# Patient Record
Sex: Male | Born: 1980 | Race: Black or African American | Hispanic: No | Marital: Single | Smoking: Never smoker
Health system: Southern US, Community
[De-identification: ages and names within clinical notes are randomized; demographics above are authoritative.]

## PROBLEM LIST (undated history)

## (undated) DIAGNOSIS — D509 Iron deficiency anemia, unspecified: Secondary | ICD-10-CM

## (undated) DIAGNOSIS — K5090 Crohn's disease, unspecified, without complications: Secondary | ICD-10-CM

## (undated) DIAGNOSIS — M1990 Unspecified osteoarthritis, unspecified site: Secondary | ICD-10-CM

## (undated) DIAGNOSIS — D573 Sickle-cell trait: Secondary | ICD-10-CM

## (undated) DIAGNOSIS — D571 Sickle-cell disease without crisis: Secondary | ICD-10-CM

## (undated) DIAGNOSIS — K529 Noninfective gastroenteritis and colitis, unspecified: Secondary | ICD-10-CM

## (undated) HISTORY — DX: Sickle-cell trait: D57.3

## (undated) HISTORY — DX: Iron deficiency anemia, unspecified: D50.9

## (undated) HISTORY — DX: Sickle-cell disease without crisis: D57.1

## (undated) HISTORY — PX: COLONOSCOPY: SHX174

## (undated) HISTORY — DX: Noninfective gastroenteritis and colitis, unspecified: K52.9

## (undated) HISTORY — DX: Crohn's disease, unspecified, without complications: K50.90

## (undated) HISTORY — DX: Unspecified osteoarthritis, unspecified site: M19.90

## (undated) HISTORY — PX: NO PAST SURGERIES: SHX2092

---

## 2004-12-15 ENCOUNTER — Emergency Department (HOSPITAL_COMMUNITY): Admission: EM | Admit: 2004-12-15 | Discharge: 2004-12-15 | Payer: Self-pay | Admitting: Family Medicine

## 2017-09-24 ENCOUNTER — Ambulatory Visit (HOSPITAL_COMMUNITY)
Admission: RE | Admit: 2017-09-24 | Discharge: 2017-09-24 | Disposition: A | Discharge status: Home / Self Care | Payer: Self-pay | Source: Ambulatory Visit | Attending: Obstetrics and Gynecology | Admitting: Obstetrics and Gynecology

## 2017-09-24 DIAGNOSIS — Z319 Encounter for procreative management, unspecified: Secondary | ICD-10-CM | POA: Insufficient documentation

## 2017-09-24 LAB — CBC
HCT: 43.2 % (ref 39.0–52.0)
Hemoglobin: 14.9 g/dL (ref 13.0–17.0)
MCH: 26.3 pg (ref 26.0–34.0)
MCHC: 34.5 g/dL (ref 30.0–36.0)
MCV: 76.2 fL — ABNORMAL LOW (ref 78.0–100.0)
PLATELETS: 222 10*3/uL (ref 150–400)
RBC: 5.67 MIL/uL (ref 4.22–5.81)
RDW: 16.2 % — AB (ref 11.5–15.5)
WBC: 5.5 10*3/uL (ref 4.0–10.5)

## 2017-09-28 LAB — HEMOGLOBINOPATHY EVALUATION
HGB A2 QUANT: 4.2 % — AB (ref 1.8–3.2)
HGB A: 63.9 % — AB (ref 96.4–98.8)
HGB C: 0 %
HGB F QUANT: 0 % (ref 0.0–2.0)
Hgb S Quant: 31.9 % — ABNORMAL HIGH
Hgb Variant: 0 %

## 2017-10-01 ENCOUNTER — Ambulatory Visit (HOSPITAL_COMMUNITY)
Admission: RE | Admit: 2017-10-01 | Discharge: 2017-10-01 | Disposition: A | Discharge status: Home / Self Care | Payer: Self-pay | Source: Ambulatory Visit | Attending: Obstetrics and Gynecology | Admitting: Obstetrics and Gynecology

## 2017-10-01 DIAGNOSIS — Z31448 Encounter for other genetic testing of male for procreative management: Secondary | ICD-10-CM | POA: Insufficient documentation

## 2017-10-01 NOTE — ED Notes (Signed)
Lab sample obtained to send with [facility] sample for control.

## 2017-10-05 ENCOUNTER — Other Ambulatory Visit: Payer: Self-pay

## 2020-07-31 ENCOUNTER — Emergency Department (HOSPITAL_BASED_OUTPATIENT_CLINIC_OR_DEPARTMENT_OTHER)
Admission: EM | Admit: 2020-07-31 | Discharge: 2020-07-31 | Disposition: A | Discharge status: Home / Self Care | Payer: Self-pay | Attending: Emergency Medicine | Admitting: Emergency Medicine

## 2020-07-31 ENCOUNTER — Emergency Department (HOSPITAL_BASED_OUTPATIENT_CLINIC_OR_DEPARTMENT_OTHER): Payer: Self-pay

## 2020-07-31 ENCOUNTER — Encounter (HOSPITAL_BASED_OUTPATIENT_CLINIC_OR_DEPARTMENT_OTHER): Payer: Self-pay

## 2020-07-31 ENCOUNTER — Other Ambulatory Visit: Payer: Self-pay

## 2020-07-31 DIAGNOSIS — D649 Anemia, unspecified: Secondary | ICD-10-CM | POA: Insufficient documentation

## 2020-07-31 DIAGNOSIS — R609 Edema, unspecified: Secondary | ICD-10-CM

## 2020-07-31 DIAGNOSIS — R600 Localized edema: Secondary | ICD-10-CM | POA: Diagnosis not present

## 2020-07-31 DIAGNOSIS — R2243 Localized swelling, mass and lump, lower limb, bilateral: Secondary | ICD-10-CM | POA: Diagnosis present

## 2020-07-31 LAB — CBC WITH DIFFERENTIAL/PLATELET
Abs Immature Granulocytes: 0.02 10*3/uL (ref 0.00–0.07)
Basophils Absolute: 0 10*3/uL (ref 0.0–0.1)
Basophils Relative: 1 %
Eosinophils Absolute: 0.3 10*3/uL (ref 0.0–0.5)
Eosinophils Relative: 4 %
HCT: 34.5 % — ABNORMAL LOW (ref 39.0–52.0)
Hemoglobin: 11.1 g/dL — ABNORMAL LOW (ref 13.0–17.0)
Immature Granulocytes: 0 %
Lymphocytes Relative: 22 %
Lymphs Abs: 1.5 10*3/uL (ref 0.7–4.0)
MCH: 23.9 pg — ABNORMAL LOW (ref 26.0–34.0)
MCHC: 32.2 g/dL (ref 30.0–36.0)
MCV: 74.2 fL — ABNORMAL LOW (ref 80.0–100.0)
Monocytes Absolute: 1.1 10*3/uL — ABNORMAL HIGH (ref 0.1–1.0)
Monocytes Relative: 15 %
Neutro Abs: 4.1 10*3/uL (ref 1.7–7.7)
Neutrophils Relative %: 58 %
Platelets: 418 10*3/uL — ABNORMAL HIGH (ref 150–400)
RBC: 4.65 MIL/uL (ref 4.22–5.81)
RDW: 15.5 % (ref 11.5–15.5)
WBC: 7 10*3/uL (ref 4.0–10.5)
nRBC: 0 % (ref 0.0–0.2)

## 2020-07-31 LAB — BASIC METABOLIC PANEL
Anion gap: 13 (ref 5–15)
BUN: 9 mg/dL (ref 6–20)
CO2: 25 mmol/L (ref 22–32)
Calcium: 8.8 mg/dL — ABNORMAL LOW (ref 8.9–10.3)
Chloride: 99 mmol/L (ref 98–111)
Creatinine, Ser: 0.95 mg/dL (ref 0.61–1.24)
GFR, Estimated: >60 mL/min (ref >60)
Glucose, Bld: 108 mg/dL — ABNORMAL HIGH (ref 70–99)
Potassium: 3.6 mmol/L (ref 3.5–5.1)
Sodium: 137 mmol/L (ref 135–145)

## 2020-07-31 NOTE — ED Notes (Signed)
Ultrasound at bedside

## 2020-07-31 NOTE — Discharge Instructions (Addendum)
Call your primary care doctor or specialist as discussed in the next 2-3 days.   Return immediately back to the ER if:  Your symptoms worsen within the next 12-24 hours. You develop new symptoms such as new fevers, persistent vomiting, new pain, shortness of breath, or new weakness or numbness, or if you have any other concerns.  

## 2020-07-31 NOTE — ED Provider Notes (Signed)
[facility] EMERGENCY DEPARTMENT Provider Note   CSN: [id number] Arrival date & time: 07/31/20  8338     History Chief Complaint  Patient presents with  . Knee Pain  . Ankle Pain    Jonathan Drake is a 39 y.o. male.  Patient with history of sickle cell trait, presents with lateral ankle and knee swelling.  Symptoms been ongoing for the past month intermittently.  He is tried compression socks without improvement.  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  Denies chest pain or shortness of breath otherwise.  No recent illnesses such as fever vomiting cough or diarrhea.        No past medical history on file.  There are no problems to display for this patient.    No family history on file.  Social History   Tobacco Use  . Smoking status: Never Smoker  Substance Use Topics  . Alcohol use: Never  . Drug use: Never    Home Medications Prior to Admission medications   Not on File    Allergies    Patient has no known allergies.  Review of Systems   Review of Systems  Constitutional: Negative for fever.  HENT: Negative for ear pain and sore throat.   Eyes: Negative for pain.  Respiratory: Negative for cough.   Cardiovascular: Negative for chest pain.  Gastrointestinal: Negative for abdominal pain.  Genitourinary: Negative for flank pain.  Musculoskeletal: Negative for back pain.  Skin: Negative for color change and rash.  Neurological: Negative for syncope.  All other systems reviewed and are negative.   Physical Exam Updated Vital Signs BP 135/83 (BP Location: Right Arm)   Pulse 87   Temp 98.2 F (36.8 C) (Oral)   Resp 14   Ht 6' (1.829 m)   Wt (!) 140.6 kg   SpO2 100%   BMI 42.04 kg/m   Physical Exam Constitutional:      General: He is not in acute distress.    Appearance: He is well-developed.  HENT:     Head: Normocephalic.     Mouth/Throat:     Mouth: Mucous membranes are moist.   Cardiovascular:     Rate and Rhythm: Normal rate.  Pulmonary:     Effort: Pulmonary effort is normal.  Abdominal:     Palpations: Abdomen is soft.  Musculoskeletal:     Right lower leg: Edema present.     Left lower leg: Edema present.  Skin:    General: Skin is warm.     Capillary Refill: Capillary refill takes less than 2 seconds.  Neurological:     General: No focal deficit present.     Mental Status: He is alert.     ED Results / Procedures / Treatments   Labs (all labs ordered are listed, but only abnormal results are displayed) Labs Reviewed  BASIC METABOLIC PANEL - Abnormal; Notable for the following components:      Result Value   Glucose, Bld 108 (*)    Calcium 8.8 (*)    All other components within normal limits  CBC WITH DIFFERENTIAL/PLATELET - Abnormal; Notable for the following components:   Hemoglobin 11.1 (*)    HCT 34.5 (*)    MCV 74.2 (*)    MCH 23.9 (*)    Platelets 418 (*)    Monocytes Absolute 1.1 (*)    All other components within normal limits    EKG None  Radiology US Venous Img  Lower Bilateral  Result Date: 07/31/2020 CLINICAL DATA:  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 EXAM: BILATERAL LOWER EXTREMITY VENOUS DOPPLER ULTRASOUND TECHNIQUE: Gray-scale sonography with compression, as well as color and duplex ultrasound, were performed to evaluate the deep venous system(s) from the level of the common femoral vein through the popliteal and proximal calf veins. COMPARISON:  None. FINDINGS: VENOUS Normal compressibility of the common femoral, superficial femoral, and popliteal veins, as well as the visualized calf veins. Visualized portions of profunda femoral vein and great saphenous vein unremarkable. No filling defects to suggest DVT on grayscale or color Doppler imaging. Doppler waveforms show normal direction of venous flow, normal respiratory plasticity and response to augmentation. Limited views of the  contralateral common femoral vein are unremarkable. OTHER A few somewhat prominent lymph nodes are seen in the left groin. Limitations: None. IMPRESSION: Negative for DVT. Lymph nodes in the left groin are presumably reactive or incidental. Electronically Signed   By: Inge Rise M.D.   On: 07/31/2020 11:29    Procedures Procedures (including critical care time)  Medications Ordered in ED Medications - No data to display  ED Course  I have reviewed the triage vital signs and the nursing notes.  Pertinent labs & imaging results that were available during my care of the patient were reviewed by me and considered in my medical decision making (see chart for details).    MDM Rules/Calculators/A&P                          D-dimer is negative.  No evidence of DVT noted.  Lab test show anemia consistent with his history of sickle cell disease.  His insurance is pending and he will have insurance by next month.  Advised to follow-up with the primary care doctor and hematologist within the month.  Advised immediate return if he has difficulty breathing worsening swelling fevers pain or any additional concerns.   Final Clinical Impression(s) / ED Diagnoses Final diagnoses:  Peripheral edema  Anemia, unspecified type    Rx / DC Orders ED Discharge Orders    None       Luna Fuse, MD 07/31/20 1155
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
## 2020-08-15 ENCOUNTER — Ambulatory Visit (INDEPENDENT_AMBULATORY_CARE_PROVIDER_SITE_OTHER): Payer: Self-pay | Admitting: Internal Medicine

## 2020-08-15 ENCOUNTER — Other Ambulatory Visit: Payer: Self-pay

## 2020-08-15 ENCOUNTER — Ambulatory Visit (HOSPITAL_COMMUNITY)
Admission: RE | Admit: 2020-08-15 | Discharge: 2020-08-15 | Disposition: A | Discharge status: Home / Self Care | Payer: No Typology Code available for payment source | Source: Ambulatory Visit | Attending: Internal Medicine | Admitting: Internal Medicine

## 2020-08-15 VITALS — BP 144/88 | HR 113 | Temp 98.2°F | Wt 271.3 lb

## 2020-08-15 DIAGNOSIS — D509 Iron deficiency anemia, unspecified: Secondary | ICD-10-CM

## 2020-08-15 DIAGNOSIS — M2550 Pain in unspecified joint: Secondary | ICD-10-CM | POA: Diagnosis not present

## 2020-08-15 DIAGNOSIS — I10 Essential (primary) hypertension: Secondary | ICD-10-CM

## 2020-08-15 DIAGNOSIS — M25571 Pain in right ankle and joints of right foot: Secondary | ICD-10-CM

## 2020-08-15 DIAGNOSIS — M25572 Pain in left ankle and joints of left foot: Secondary | ICD-10-CM

## 2020-08-15 MED ORDER — HYDROCHLOROTHIAZIDE 25 MG PO TABS: 25.0000 mg | ORAL_TABLET | Freq: Every day | ORAL | 0 refills | Status: DC

## 2020-08-15 MED ORDER — DICLOFENAC SODIUM 1 % EX GEL: 4.0000 g | Freq: Four times a day (QID) | CUTANEOUS | 1 refills | Status: DC
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
## 2020-08-15 NOTE — Patient Instructions (Addendum)
Jonathan Drake, It was nice meeting you, and we're glad to have you establishing care with [place]!   Today we talked about:   Your joint pain and swelling: I'm going to get some lab work and x-rays today to start trying to figure out an underlying cause. In the mean time, you may get benefit from a topical inflammatory gel that I have sent to your pharmacy.   Blood pressure: your blood pressure has been elevated on multiple visits so I think it would be reasonable to go ahead and start you on a medication. This may help your swelling a bit as well.   Let's see you back in 2 weeks to re-check your blood pressure and discuss next steps depending on what your work-up shows.  Take care! Dr. Koleen Distance

## 2020-08-16 ENCOUNTER — Encounter: Payer: Self-pay | Admitting: Internal Medicine

## 2020-08-16 DIAGNOSIS — M2550 Pain in unspecified joint: Secondary | ICD-10-CM | POA: Insufficient documentation

## 2020-08-16 DIAGNOSIS — D509 Iron deficiency anemia, unspecified: Secondary | ICD-10-CM | POA: Insufficient documentation

## 2020-08-16 DIAGNOSIS — I10 Essential (primary) hypertension: Secondary | ICD-10-CM | POA: Insufficient documentation

## 2020-08-16 LAB — MICROSCOPIC EXAMINATION
Bacteria, UA: NONE SEEN
Casts: NONE SEEN /lpf
WBC, UA: NONE SEEN /hpf (ref 0–5)

## 2020-08-16 LAB — CMP14 + ANION GAP
Albumin: 3.3 g/dL — ABNORMAL LOW (ref 4.0–5.0)
CO2: 25 mmol/L (ref 20–29)
GFR calc Af Amer: 127 mL/min/{1.73_m2} (ref >59)

## 2020-08-16 LAB — URINALYSIS, COMPLETE
Bilirubin, UA: NEGATIVE
Glucose, UA: NEGATIVE
Leukocytes,UA: NEGATIVE
Nitrite, UA: NEGATIVE
RBC, UA: NEGATIVE
Specific Gravity, UA: 1.012 (ref 1.005–1.030)
Urobilinogen, Ur: 0.2 mg/dL (ref 0.2–1.0)
pH, UA: 6 (ref 5.0–7.5)

## 2020-08-16 LAB — FERRITIN: Ferritin: 777 ng/mL — ABNORMAL HIGH (ref 30–400)

## 2020-08-16 MED ORDER — FERROUS SULFATE 325 (65 FE) MG PO TABS: 325.0000 mg | ORAL_TABLET | Freq: Every day | ORAL | 3 refills | Status: DC

## 2020-08-16 NOTE — Assessment & Plan Note (Signed)
Patient has now had multiple elevated blood pressure readings in various clinical settings.  Will start him on HCTZ 25 mg daily. Re-check BP and repeat BMP at follow-up visit in 2 weeks.

## 2020-08-16 NOTE — Assessment & Plan Note (Addendum)
Patient presents with joint pain and swelling that began approximately 1 month ago. Symptoms started in both ankles which is what prompted visit to the ED. Work-up there was unremarkable apart from microcytic anemia. 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 Swelling will eventually go down with elevation of his legs at night. He tries to combat swelling with compression stockings without much relief.  He endorses a deep ache in both ankles that is worse with ambulation.  A few days ago, he noticed his right hand developed similar pain and swelling. He cannot make a fist due to the pain and swelling around his knuckles.  He has never had a history of similar symptoms. No other systemic symptoms. No family history of autoimmune or rheumatologic diseases.   Overall, symptoms concerning for underlying autoimmune or inflammatory process which would also explain his microcytic anemia.  X-rays of his joints today do not show any significant joint breakdown.  Will start basic work-up with CMP, ESR, CRP, U/A, ANA.  Close follow-up in 2 weeks to determine next steps and if referral is indicated.

## 2020-08-16 NOTE — Assessment & Plan Note (Signed)
Iron studies today consistent with iron deficiency. Ferritin is elevated which would go along with underlying inflammatory or autoimmune disorder. Will start PO supplementation. Re-check iron studies in several weeks, and if he is not responding appropriately we will need to set him up for IV iron infusion.

## 2020-08-17 LAB — CMP14 + ANION GAP
ALT: 17 IU/L (ref 0–44)
AST: 13 IU/L (ref 0–40)
Albumin/Globulin Ratio: 0.7 — ABNORMAL LOW (ref 1.2–2.2)
Alkaline Phosphatase: 55 IU/L (ref 44–121)
Anion Gap: 16 mmol/L (ref 10.0–18.0)
BUN/Creatinine Ratio: 8 — ABNORMAL LOW (ref 9–20)
BUN: 7 mg/dL (ref 6–20)
Bilirubin Total: 0.4 mg/dL (ref 0.0–1.2)
Calcium: 9.3 mg/dL (ref 8.7–10.2)
Chloride: 95 mmol/L — ABNORMAL LOW (ref 96–106)
Creatinine, Ser: 0.88 mg/dL (ref 0.76–1.27)
GFR calc non Af Amer: 110 mL/min/{1.73_m2} (ref >59)
Globulin, Total: 5 g/dL — ABNORMAL HIGH (ref 1.5–4.5)
Glucose: 114 mg/dL — ABNORMAL HIGH (ref 65–99)
Potassium: 4.2 mmol/L (ref 3.5–5.2)
Sodium: 136 mmol/L (ref 134–144)
Total Protein: 8.3 g/dL (ref 6.0–8.5)

## 2020-08-17 LAB — IRON: Iron: 15 ug/dL — ABNORMAL LOW (ref 38–169)

## 2020-08-17 LAB — C-REACTIVE PROTEIN: CRP: 132 mg/L — ABNORMAL HIGH (ref 0–10)

## 2020-08-17 LAB — SEDIMENTATION RATE: Sed Rate: 1 mm/hr (ref 0–15)

## 2020-08-17 LAB — ANTINUCLEAR ANTIBODIES, IFA: ANA Titer 1: NEGATIVE

## 2020-08-19 NOTE — Progress Notes (Signed)
Internal Medicine Clinic Attending  Case discussed with Dr. Bloomfield  At the time of the visit.  We reviewed the resident's history and exam and pertinent patient test results.  I agree with the assessment, diagnosis, and plan of care documented in the resident's note.  

## 2020-08-29 ENCOUNTER — Ambulatory Visit: Payer: No Typology Code available for payment source | Admitting: Internal Medicine

## 2020-08-29 ENCOUNTER — Encounter: Payer: Self-pay | Admitting: Internal Medicine

## 2020-08-29 VITALS — BP 137/74 | HR 96 | Wt 266.0 lb

## 2020-08-29 DIAGNOSIS — D509 Iron deficiency anemia, unspecified: Secondary | ICD-10-CM

## 2020-08-29 DIAGNOSIS — M2550 Pain in unspecified joint: Secondary | ICD-10-CM | POA: Diagnosis not present

## 2020-08-29 DIAGNOSIS — I10 Essential (primary) hypertension: Secondary | ICD-10-CM | POA: Diagnosis not present

## 2020-08-29 NOTE — Patient Instructions (Signed)
Jonathan Drake, It was nice seeing you again!  Today we discussed:  Blood pressure - it looks better after starting the medication. I will check your kidney function and electrolytes today to be sure everything looks good.   Joint pain - you can continue taking ibuprofen and using the gel like we discussed  Iron deficiency/loose stools - I am referring you to a GI specialist for further work-up. It may give [place] the answers for both the GI symptoms and your joint pain   Take care, Dr. Koleen Distance

## 2020-08-29 NOTE — Progress Notes (Signed)
Acute Office Visit  Subjective:    Patient ID: Jonathan Drake, male    DOB: May 10, 1981, 40 y.o.   MRN: [id number]  Chief Complaint  Patient presents with  . 2 Week Follow-up    HPI Patient is in today for follow-up on joint pain and chronic loose stools. Please see problem based charting for further details on today's visit.   Past Medical History:  Diagnosis Date  . Iron deficiency anemia   . Sickle cell trait ([place])     History reviewed. No pertinent surgical history.  Family History  Problem Relation Age of Onset  . Diabetes Mother   . Aneurysm Mother   . CAD Father   . Arrhythmia Brother        2 brothers with pacemakers   . Heart attack Other        uncle died of a heart attack at age 79    Social History   Socioeconomic History  . Marital status: Single    Spouse name: Not on file  . Number of children: Not on file  . Years of education: Not on file  . Highest education level: Not on file  Occupational History  . Not on file  Tobacco Use  . Smoking status: Never Smoker  . Smokeless tobacco: Never Used  Substance and Sexual Activity  . Alcohol use: Never  . Drug use: Never  . Sexual activity: Not on file  Other Topics Concern  . Not on file  Social History Narrative  . Not on file   Social Determinants of Health   Financial Resource Strain: Not on file  Food Insecurity: Not on file  Transportation Needs: Not on file  Physical Activity: Not on file  Stress: Not on file  Social Connections: Not on file  Intimate Partner Violence: Not on file    Outpatient Medications Prior to Visit  Medication Sig Dispense Refill  . diclofenac Sodium (VOLTAREN) 1 % GEL Apply 4 g topically 4 (four) times daily. 350 g 1  . ferrous sulfate 325 (65 FE) MG tablet Take 1 tablet (325 mg total) by mouth daily. 30 tablet 3  . hydrochlorothiazide (HYDRODIURIL) 25 MG tablet Take 1 tablet (25 mg total) by mouth daily. 30 tablet 0   No facility-administered medications  prior to visit.    No Known Allergies  Review of Systems  Constitutional: Negative for chills, fatigue, fever and unexpected weight change.  HENT: Negative for mouth sores.   Eyes: Negative for visual disturbance.  Gastrointestinal: Positive for blood in stool and diarrhea. Negative for abdominal distention, abdominal pain, nausea, rectal pain and vomiting.  Musculoskeletal: Positive for arthralgias and joint swelling. Negative for back pain.  Skin: Negative for rash.       Objective:    Physical Exam Constitutional:      General: He is not in acute distress.    Appearance: Normal appearance.  Abdominal:     General: There is no distension.     Palpations: Abdomen is soft.     Tenderness: There is no abdominal tenderness.  Musculoskeletal:     Comments: Right hand and bilateral ankle swelling unchanged from previous exam. The joints themselves are less tender to palpation. His ROM has slightly improved in the right hand in terms of ability to make a fist.   Neurological:     Mental Status: He is alert.     BP 137/74 (BP Location: Left Arm, Patient Position: Sitting, Cuff Size: Normal)  Pulse 96   Wt 266 lb (120.7 kg)   SpO2 97%   BMI 36.08 kg/m  Wt Readings from Last 3 Encounters:  08/29/20 266 lb (120.7 kg)  08/15/20 271 lb 4.8 oz (123.1 kg)  07/31/20 (!) 310 lb (140.6 kg)    Health Maintenance Due  Topic Date Due  . Hepatitis C Screening  Never done  . COVID-19 Vaccine (1) Never done  . HIV Screening  Never done  . TETANUS/TDAP  Never done  . INFLUENZA VACCINE  Never done    There are no preventive care reminders to display for this patient.   No results found for: TSH Lab Results  Component Value Date   WBC 7.0 07/31/2020   HGB 11.1 (L) 07/31/2020   HCT 34.5 (L) 07/31/2020   MCV 74.2 (L) 07/31/2020   PLT 418 (H) 07/31/2020   Lab Results  Component Value Date   NA 138 08/29/2020   K 3.9 08/29/2020   CO2 27 08/29/2020   GLUCOSE 113 (H)  08/29/2020   BUN 7 08/29/2020   CREATININE 0.72 (L) 08/29/2020   BILITOT 0.4 08/15/2020   ALKPHOS 55 08/15/2020   AST 13 08/15/2020   ALT 17 08/15/2020   PROT 8.3 08/15/2020   ALBUMIN 3.3 (L) 08/15/2020   CALCIUM 9.4 08/29/2020   ANIONGAP 13 07/31/2020   No results found for: CHOL No results found for: HDL No results found for: LDLCALC No results found for: TRIG No results found for: CHOLHDL No results found for: HGBA1C     Assessment & Plan:   Problem List Items Addressed This Visit      Cardiovascular and Mediastinum   Hypertension - Primary    Blood pressure has improved since starting HCTZ 25 mg at last visit. BMP shows stable renal function and electrolytes.  Continue current therapy.       Relevant Orders   BMP8+Anion Gap (Completed)     Other   Polyarthralgia    Joint swelling unchanged from last visit. He has not developed symptoms in any other joints. Work-up thus far shows elevated CRP and ferritin consistent with inflammatory process.  ANA was normal. His pain is fairly controlled on Ibuprofen for the time being so will hold off on steroids for now to avoid potentially affecting colonoscopy results. Checking RF and CCP today. Further work-up pending GI referral as below.       Relevant Orders   Rheumatoid (RA) Factor (Completed)   CYCLIC CITRUL PEPTIDE ANTIBODY, IGG/IGA (Completed)   Iron deficiency anemia    Mr. Witt reports several month history of intermittent loose stools. Symptoms will flare for 1-2 weeks at a time, and he will subsequently have more formed stools. For the last month, he feels like his stools have been consistently loose. He also notes occasional blood visible in the bowl and when he wipes. He denies any abdominal pain, pain with BMs, or tenesmus. Certain foods do not seem to affect his bowel movements. He has lost approximately 50 lbs in the last year, but states this was intentional through his dieting. Symptoms concerning for  inflammatory bowel disease or malabsorption. Will refer to GI for diagnostic colonoscopy. Depending on work-up, this may be the underlying cause for his polyarthralgia as well.  Continue PO iron supplementation which he is tolerating well.       Relevant Orders   Ambulatory referral to Gastroenterology       No orders of the defined types were placed in this encounter.  Delice Bison, DO

## 2020-08-30 ENCOUNTER — Encounter: Payer: Self-pay | Admitting: Internal Medicine

## 2020-08-30 NOTE — Assessment & Plan Note (Addendum)
Jonathan Drake reports several month history of intermittent loose stools. Symptoms will flare for 1-2 weeks at a time, and he will subsequently have more formed stools. For the last month, he feels like his stools have been consistently loose. He also notes occasional blood visible in the bowl and when he wipes. He denies any abdominal pain, pain with BMs, or tenesmus. Certain foods do not seem to affect his bowel movements. He has lost approximately 50 lbs in the last year, but states this was intentional through his dieting. Symptoms concerning for inflammatory bowel disease or malabsorption. Will refer to GI for diagnostic colonoscopy. Depending on work-up, this may be the underlying cause for his polyarthralgia as well.  Continue PO iron supplementation which he is tolerating well.

## 2020-08-30 NOTE — Assessment & Plan Note (Signed)
Joint swelling unchanged from last visit. He has not developed symptoms in any other joints. Work-up thus far shows elevated CRP and ferritin consistent with inflammatory process.  ANA was normal. His pain is fairly controlled on Ibuprofen for the time being so will hold off on steroids for now to avoid potentially affecting colonoscopy results. Checking RF and CCP today. Further work-up pending GI referral as below.

## 2020-08-30 NOTE — Assessment & Plan Note (Addendum)
Blood pressure has improved since starting HCTZ 25 mg at last visit. BMP shows stable renal function and electrolytes.  Continue current therapy.

## 2020-08-31 LAB — BMP8+ANION GAP
Anion Gap: 14 mmol/L (ref 10.0–18.0)
BUN/Creatinine Ratio: 10 (ref 9–20)
BUN: 7 mg/dL (ref 6–20)
CO2: 27 mmol/L (ref 20–29)
Calcium: 9.4 mg/dL (ref 8.7–10.2)
Chloride: 97 mmol/L (ref 96–106)
Creatinine, Ser: 0.72 mg/dL — ABNORMAL LOW (ref 0.76–1.27)
GFR calc Af Amer: 138 mL/min/{1.73_m2} (ref >59)
GFR calc non Af Amer: 119 mL/min/{1.73_m2} (ref >59)
Glucose: 113 mg/dL — ABNORMAL HIGH (ref 65–99)
Potassium: 3.9 mmol/L (ref 3.5–5.2)
Sodium: 138 mmol/L (ref 134–144)

## 2020-08-31 LAB — RHEUMATOID FACTOR: Rhuematoid fact SerPl-aCnc: 14.1 IU/mL — ABNORMAL HIGH (ref <14.0)

## 2020-08-31 LAB — CYCLIC CITRUL PEPTIDE ANTIBODY, IGG/IGA: Cyclic Citrullin Peptide Ab: 12 units (ref 0–19)

## 2020-09-02 ENCOUNTER — Encounter: Payer: Self-pay | Admitting: Internal Medicine

## 2020-09-02 MED ORDER — IBUPROFEN 600 MG PO TABS: 600.0000 mg | ORAL_TABLET | Freq: Four times a day (QID) | ORAL | 0 refills | Status: DC | PRN

## 2020-09-02 NOTE — Progress Notes (Signed)
Internal Medicine Clinic Attending  Case discussed with Dr. Bloomfield  At the time of the visit.  We reviewed the resident's history and exam and pertinent patient test results.  I agree with the assessment, diagnosis, and plan of care documented in the resident's note.  

## 2020-09-03 ENCOUNTER — Encounter: Payer: Self-pay | Admitting: Internal Medicine

## 2020-09-03 ENCOUNTER — Ambulatory Visit (INDEPENDENT_AMBULATORY_CARE_PROVIDER_SITE_OTHER): Payer: No Typology Code available for payment source | Admitting: Internal Medicine

## 2020-09-03 ENCOUNTER — Other Ambulatory Visit: Payer: No Typology Code available for payment source

## 2020-09-03 VITALS — BP 120/68 | HR 106 | Ht 72.0 in | Wt 262.4 lb

## 2020-09-03 DIAGNOSIS — D500 Iron deficiency anemia secondary to blood loss (chronic): Secondary | ICD-10-CM

## 2020-09-03 DIAGNOSIS — R197 Diarrhea, unspecified: Secondary | ICD-10-CM | POA: Diagnosis not present

## 2020-09-03 DIAGNOSIS — M130 Polyarthritis, unspecified: Secondary | ICD-10-CM

## 2020-09-03 MED ORDER — SUTAB 1479-225-188 MG PO TABS: 1.0000 | ORAL_TABLET | Freq: Once | ORAL | 0 refills | Status: AC

## 2020-09-03 NOTE — Progress Notes (Signed)
Patient ID: Jonathan Drake, male   DOB: 14-Nov-1980, 40 y.o.   MRN: [id number] HPI: Jonathan Drake is a 40 year old male with sickle cell trait who is seen in consult at the request of Dr. Koleen Distance and Dr. Evette Doffing to evaluate bloody diarrhea, diffuse joint pains and iron deficiency anemia.  He is here alone today.  He reports that symptoms really seem to start after Thanksgiving 2021 though in June 2021 he had a few days here and there of looser stools.  Prior to the last few months he would have regular bowel movements without blood occurring usually 1 occasionally 2 times per day.  At that time stools were formed and normal-appearing.  However after Thanksgiving and in early December he developed joint pains and lower extremity swelling.  He had joint pains in his ankles knees and hands.  No back pain, shoulder pain or hip pain.  He also developed loose stools with intermittent blood.  Stools are occurring at least 3 times per day and can be urgent.  He has not had any abdominal pain.  He has not had upper GI symptoms such as heartburn, dysphagia, odynophagia, nausea or vomiting.  He has lost nearly 40 pounds over the last 3 months but he reports some of this is due to diet.  He has been conscious with what he eats but also states he is afraid to eat certain foods because he worries it will make the diarrhea worse.  He is avoided sugar drinks and higher fat foods such as potato chips.  He was started on oral iron and hydrochlorothiazide about 2 weeks ago by primary care.  His lower extremity swelling is still present in the evening but better in the morning.  He is also using ibuprofen at least once daily in the evening.  He is also recently started on vitamin D supplementation.  He has a fiance, [employer], as well as 4 children.  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  No tobacco or alcohol.  No illicit drug use.  He has sickle cell trait which was  diagnosed several years ago when his partner also was noted to have sickle cell trait and they were planning to have children.  Anemia is new for him.  There is no family history of GI tract malignancy, celiac disease or IBD to his knowledge.  Past Medical History:  Diagnosis Date  . Iron deficiency anemia   . Sickle cell trait [facility])     Past Surgical History:  Procedure Laterality Date  . NO PAST SURGERIES      Outpatient Medications Prior to Visit  Medication Sig Dispense Refill  . acetaminophen (TYLENOL 8 HOUR ARTHRITIS PAIN) 650 MG CR tablet Take 650 mg by mouth every 8 (eight) hours as needed for pain.    . ferrous sulfate 325 (65 FE) MG tablet Take 1 tablet (325 mg total) by mouth daily. 30 tablet 3  . hydrochlorothiazide (HYDRODIURIL) 25 MG tablet Take 1 tablet (25 mg total) by mouth daily. 30 tablet 0  . ibuprofen (ADVIL) 600 MG tablet Take 1 tablet (600 mg total) by mouth every 6 (six) hours as needed. 30 tablet 0  . diclofenac Sodium (VOLTAREN) 1 % GEL Apply 4 g topically 4 (four) times daily. (Patient not taking: Reported on 09/03/2020) 350 g 1   No facility-administered medications prior to visit.    No Known Allergies  Family History  Problem Relation Age  of Onset  . Diabetes Mother   . Aneurysm Mother   . CAD Father   . Arrhythmia Brother        2 brothers with pacemakers   . Heart attack Other        uncle died of a heart attack at age 39  . Colon cancer Neg Hx   . Esophageal cancer Neg Hx   . Pancreatic cancer Neg Hx   . Ulcerative colitis Neg Hx     Social History   Tobacco Use  . Smoking status: Never Smoker  . Smokeless tobacco: Never Used  Vaping Use  . Vaping Use: Never used  Substance Use Topics  . Alcohol use: Never  . Drug use: Never    ROS: As per history of present illness, otherwise negative  BP 120/68 (BP Location: Left Arm, Patient Position: Sitting, Cuff Size: Large)   Pulse (!) 106   Ht 6' (1.829 m)   Wt 262 lb 6 oz (119 kg)    BMI 35.58 kg/m  Constitutional: Well-developed and well-nourished. No distress. HEENT: Normocephalic and atraumatic.  Conjunctivae are normal.  No scleral icterus. Neck: Neck supple. Trachea midline. Cardiovascular: Tachycardic but regular. No M/R/G Pulmonary/chest: Effort normal and breath sounds normal. No wheezing, rales or rhonchi. Abdominal: Soft, nontender, nondistended. Bowel sounds active throughout. There are no masses palpable. No hepatosplenomegaly. Extremities: no clubbing, cyanosis, trace pretibial edema to the midshin Neurological: Alert and oriented to person place and time. Skin: Skin is warm and dry.  Psychiatric: Normal mood and affect. Behavior is normal.  RELEVANT LABS AND IMAGING: CBC    Component Value Date/Time   WBC 7.0 07/31/2020 1031   RBC 4.65 07/31/2020 1031   HGB 11.1 (L) 07/31/2020 1031   HCT 34.5 (L) 07/31/2020 1031   PLT 418 (H) 07/31/2020 1031   MCV 74.2 (L) 07/31/2020 1031   MCH 23.9 (L) 07/31/2020 1031   MCHC 32.2 07/31/2020 1031   RDW 15.5 07/31/2020 1031   LYMPHSABS 1.5 07/31/2020 1031   MONOABS 1.1 (H) 07/31/2020 1031   EOSABS 0.3 07/31/2020 1031   BASOSABS 0.0 07/31/2020 1031    CMP     Component Value Date/Time   NA 138 08/29/2020 1407   K 3.9 08/29/2020 1407   CL 97 08/29/2020 1407   CO2 27 08/29/2020 1407   GLUCOSE 113 (H) 08/29/2020 1407   GLUCOSE 108 (H) 07/31/2020 1031   BUN 7 08/29/2020 1407   CREATININE 0.72 (L) 08/29/2020 1407   CALCIUM 9.4 08/29/2020 1407   PROT 8.3 08/15/2020 1446   ALBUMIN 3.3 (L) 08/15/2020 1446   AST 13 08/15/2020 1446   ALT 17 08/15/2020 1446   ALKPHOS 55 08/15/2020 1446   BILITOT 0.4 08/15/2020 1446   GFRNONAA 119 08/29/2020 1407   GFRNONAA >60 07/31/2020 1031   GFRAA 138 08/29/2020 1407   Rheumatoid factor positive at 14.1 ESR 1 CRP 132 Ferritin 777 Iron 15 ANA negative CCP negative  ASSESSMENT/PLAN: 40 year old male with sickle cell trait who is seen in consult at the request of  Dr. Koleen Distance and Dr. Evette Doffing to evaluate bloody diarrhea, diffuse joint pains and iron deficiency anemia.   1.  Bloody diarrhea/iron deficiency anemia --very suspicious for inflammatory diarrhea and specifically IBD.  We discussed this together.  Also arthropathy possibly related IBD or overlapping with an autoimmune inflammatory arthropathy.  We discussed this together today.  His ferritin is elevated likely is an acute phase reactant though iron was low.  He is microcytic but  also has sickle cell trait.  I recommended the following: --Exclusion of C. difficile with PCR --1 C. 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  Will evaluate #1 above.  This could be arthropathy related to IBD or overlapping inflammatory arthropathy for which I would want him to see rheumatology.  Will likely need to limit NSAIDs given #1 and certainly complete avoidance if he has IBD.  XF:FKVQOHC, Mallie Mussel, [facility] [address],  [place] [id number]  Modena Nunnery, DO

## 2020-09-03 NOTE — Patient Instructions (Signed)
Your provider has requested that you go to the basement level for lab work before leaving today. Press "B" on the elevator. The lab is located at the first door on the left as you exit the elevator.  You have been scheduled for an endoscopy and colonoscopy. Please follow written instructions given to you at your visit today.  Please pick up your prep supplies at the pharmacy within the next 1-3 days. If you use inhalers (even only as needed), please bring them with you on the day of your procedure.

## 2020-09-04 LAB — CLOSTRIDIUM DIFFICILE BY PCR: Toxigenic C. Difficile by PCR: NEGATIVE

## 2020-09-05 ENCOUNTER — Other Ambulatory Visit: Payer: Self-pay | Admitting: Internal Medicine

## 2020-09-06 LAB — SARS CORONAVIRUS 2 (TAT 6-24 HRS): SARS Coronavirus 2: NEGATIVE

## 2020-09-09 ENCOUNTER — Encounter: Payer: No Typology Code available for payment source | Admitting: Internal Medicine

## 2020-09-09 ENCOUNTER — Encounter: Payer: Self-pay | Admitting: Internal Medicine

## 2020-09-09 NOTE — Telephone Encounter (Signed)
I have spoken to this very nice patient to advise that we do still very much recommend both endoscopy and colonoscopy especially in lite of recent elevated labwork etc. Advised that unfortunately, his insurance plan just doesn't really provide coverage for these so he would be responsible for any accrued fees. Patient is advised that it may be beneficial for him to apply for [facility] assistance to see if they may be able to get his costs down for procedures or even cover them retroactively. He is also advised to maybe even look into other insurance plans that cover procedures. Patient indicates that he is already looking around and that he was already in touch with [employer] [employer] but was told he would have to first be billed for his procedures before he could be considered for the assistance. Patient thanked me for the call.

## 2020-09-09 NOTE — Telephone Encounter (Signed)
I have left a message for patient to call back.

## 2020-09-09 NOTE — Telephone Encounter (Signed)
Jonathan Drake,  Here is a message from the patient.  Colonoscopy really is the best option. If I had to choose colonoscopy more important than EGD, but with coverage I still recommend both tests Thanks for trying to help CC: [facility]

## 2020-09-09 NOTE — Telephone Encounter (Signed)
Per Morton Amy, precert coordinator,  Good morning! Yes Sir. I put a note in the ambulatory referral on 09/05/20. Per Chrisina @ Benefits Pt has a limited policy and has no diagnostic benefits for these procedures. Left detailed msg on patient's vm and asked if he needed to cancel to please call in.  Per Dr Hilarie Fredrickson,  ok thanks. Dottie, can you reach out to him. No other great option for diagnosis. Concern is IBD. He should be directed to get [facility] assistance or at least apply. my understanding that if granted this assistance can be retroactive for services already rendered

## 2020-09-12 ENCOUNTER — Ambulatory Visit (INDEPENDENT_AMBULATORY_CARE_PROVIDER_SITE_OTHER): Payer: No Typology Code available for payment source | Admitting: Internal Medicine

## 2020-09-12 ENCOUNTER — Other Ambulatory Visit: Payer: Self-pay

## 2020-09-12 ENCOUNTER — Encounter: Payer: Self-pay | Admitting: Internal Medicine

## 2020-09-12 VITALS — BP 140/79 | HR 91 | Temp 98.6°F | Ht 72.0 in | Wt 203.2 lb

## 2020-09-12 DIAGNOSIS — M2550 Pain in unspecified joint: Secondary | ICD-10-CM | POA: Diagnosis not present

## 2020-09-12 MED ORDER — DICLOFENAC SODIUM 1 % EX GEL: 4.0000 g | Freq: Four times a day (QID) | CUTANEOUS | 1 refills | Status: DC

## 2020-09-12 NOTE — Progress Notes (Signed)
   CC: Polyarthralgia   HPI:Mr.Jonathan Drake is a 40 y.o. male who presents for evaluation of polyarthralgia. Please see individual problem based A/P for details.    Past Medical History:  Diagnosis Date  . Iron deficiency anemia   . Sickle cell trait ([place])    Review of Systems:   Review of Systems  Constitutional: Positive for weight loss. Negative for chills and fever.  Respiratory: Negative for cough and shortness of breath.   Gastrointestinal: Negative for blood in stool (none this week).  Musculoskeletal: Positive for joint pain. Negative for falls.     Physical Exam: Vitals:   09/12/20 1609  BP: 140/79  Pulse: 91  Temp: 98.6 F (37 C)  TempSrc: Oral  SpO2: 99%  Weight: 203 lb 3.2 oz (92.2 kg)  Height: 6' (1.829 m)   Physical Exam Constitutional:      General: He is not in acute distress.    Appearance: He is not ill-appearing.  Cardiovascular:     Rate and Rhythm: Normal rate and regular rhythm.  Pulmonary:     Breath sounds: No wheezing or rhonchi.  Abdominal:     General: Bowel sounds are normal.     Palpations: Abdomen is soft.  Musculoskeletal:        General: Swelling (bilateral hands) and tenderness (bilateral hands ) present.  Skin:    Findings: No erythema or rash.  Neurological:     Sensory: No sensory deficit.     Motor: Weakness (weakness on hand grip in both hands) present.  Psychiatric:        Mood and Affect: Mood normal.        Behavior: Behavior normal.      Assessment & Plan:   See Encounters Tab for problem based charting.  Patient discussed with Dr. Jimmye Norman

## 2020-09-12 NOTE — Patient Instructions (Addendum)
Thank you, Mr.Marven F Cosgriff for allowing [place] to provide your care today. Today we discussed polyarthralgias , work Physicist, medical, child support letter, and referral to rheumatology  I have ordered the following labs for you:  Lab Orders  No laboratory test(s) ordered today     Tests ordered today:    Referrals ordered today:    Referral Orders     Ambulatory referral to Rheumatology   I have ordered the following medication/changed the following medications:   Stop the following medications: There are no discontinued medications.   Start the following medications: Meds ordered this encounter  Medications  . diclofenac Sodium (VOLTAREN) 1 % GEL    Sig: Apply 4 g topically 4 (four) times daily.    Dispense:  100 g    Refill:  1     Follow up: 2 months    Remember: I will follow up with Dr. Rhea Belton tomorrow and give you a call on our recommendations  Should you have any questions or concerns please call the internal medicine clinic at [phone].      Thurmon Fair, M.D. [facility]

## 2020-09-13 ENCOUNTER — Encounter: Payer: Self-pay | Admitting: *Deleted

## 2020-09-13 ENCOUNTER — Encounter: Payer: Self-pay | Admitting: Internal Medicine

## 2020-09-13 ENCOUNTER — Telehealth: Payer: Self-pay | Admitting: *Deleted

## 2020-09-13 DIAGNOSIS — M130 Polyarthritis, unspecified: Secondary | ICD-10-CM

## 2020-09-13 DIAGNOSIS — R197 Diarrhea, unspecified: Secondary | ICD-10-CM

## 2020-09-13 DIAGNOSIS — D500 Iron deficiency anemia secondary to blood loss (chronic): Secondary | ICD-10-CM

## 2020-09-13 NOTE — Telephone Encounter (Signed)
-----   Message from Jerene Bears, MD sent at 09/13/2020  1:48 PM EST ----- Regarding: RE: Prednisone? I will get him rescheduled.  Thanks Willis Modena Pt needs ECL after 10/08/20, same indications as before JMP  ----- Message ----- From: Madalyn Rob, MD Sent: 09/13/2020   1:47 PM EST To: Jerene Bears, MD Subject: RE: Prednisone?                                Dr.Pyrtle,  He has new insurance which goes in affect March 1.  This insurance will cover the EGD and colonoscopy.  He has short-term disability which will keep him afloat this month.  If things do not worsen he says he can tolerate the discomfort. Given this new information and knowing it could interfere with your endoscopic evaluation, I favor holding off. Anything I need to do to get him back on your schedule in March?   Thanks,  Merry Proud ----- Message ----- From: Jerene Bears, MD Sent: 09/13/2020  11:25 AM EST To: Madalyn Rob, MD Subject: RE: Prednisone?                                Hi Merry Proud Thanks for your message. Unfortunately he cancelled his egd and colon due to lack of insurance coverage This is very unfortunate and I was hoping he could get [facility] assistance so we can proceed. From GI standpt I would not treat with steroids until endoscopic eval as this could alter the diagnostic potential of the scopes. That said, if he cannot get his procedures due to lack of insurance coverage or lack of [facility] assistance, then I have no issue with prednisone (esp for another indication such as arthropathy). What dose and duration were you considering? Short course likely would not alter the gut for very long. Thanks Zenovia Jarred  ----- Message ----- From: Madalyn Rob, MD Sent: 09/13/2020   9:07 AM EST To: Jerene Bears, MD Subject: Prednisone?                                    Dr.Pyrtle,  Dr.Bloomfield/Dr.Vincent referred this patient to you. I saw him in clinic yesterday. Due to poor grip strength/hand pain he can not work (drive the  18 wheeler safely). Your thoughts on treating with prednisone or waiting until you can perform endoscopy/colonoscopy? I placed the rheumatology referral , so hopefully he can get in not long after your testing.   Thank you for your time,  Tamsen Snider PGY2 - Internal Medicine

## 2020-09-13 NOTE — Assessment & Plan Note (Addendum)
See previous notes by Dr. Chesley Mires and notes by Dr. Rhea Belton with gastroenterology.  Patient is here to discuss worsening symptoms he has been worked up for including bloody diarrhea and diffuse joint pains.  It is affecting both right and left hand mostly this week.  He is awaiting his new insurance to kick in to pay for endoscopy and colonoscopy.  However the worsening pain in his hands and weakness makes it unsafe for him to work.  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  He asked if we can provide documentation he has been worked up for this condition and also documentation for the court ( may prevent him from paying child support).  Given the impact this is having on his life I would discuss with Dr. Rhea Belton his thoughts on starting prednisone or if he would prefer waiting until after colonoscopy/edndoscopy.  I do not see a rheumatology referral placed.  In my experience it takes a few months to get a rheumatology appointment. We will place referral today so his care is not delayed after colonoscopy and endoscopy. He has been taking Ibuprofen on empty stomach, recommended taking with food. Will prescribe Voltaren gel , so patient can avoid oral NSAIDS if possible.   - Contact Dr.Pyrtle to discuss starting prednisone - Send letters patient has requested - Referral to Rheumatology - Voltaren gel

## 2020-09-13 NOTE — Telephone Encounter (Signed)
I have spoken to patient and rescheduled his appointment for endoscopy/colonoscopy on 10/18/20. New instructions have been sent to him via [place]. In addition, he is rescheduled for COVID testing and this is also reflected in his updated instructions. Patient verbalizes understanding. He indicates that he will get [place] updated insurance information as soon as his card becomes available to him.

## 2020-09-16 ENCOUNTER — Telehealth: Payer: Self-pay | Admitting: Internal Medicine

## 2020-09-16 ENCOUNTER — Other Ambulatory Visit: Payer: Self-pay

## 2020-09-16 ENCOUNTER — Encounter: Payer: Self-pay | Admitting: Internal Medicine

## 2020-09-16 ENCOUNTER — Other Ambulatory Visit: Payer: Self-pay | Admitting: Internal Medicine

## 2020-09-16 MED ORDER — FERROUS SULFATE 325 (65 FE) MG PO TABS: 325.0000 mg | ORAL_TABLET | Freq: Every day | ORAL | 3 refills | Status: DC

## 2020-09-16 NOTE — Telephone Encounter (Signed)
This patient is undergoing a workup for his inflammatory arthropathy which is effecting his ability to work. He has a EGD/Colonoscopy rescheduled now for 3/11. I will ask for patient to be scheduled with his PCP, Dr.Bloomfield, after his EGD/Colonoscopy. Likely she is back in Clinic at the beginning of April and his result will be back then.  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

## 2020-09-16 NOTE — Telephone Encounter (Signed)
ferrous sulfate 325 (65 FE) MG tablet, REFILL REQUEST @  [id number] [address] [place]:  [phone] Fax:  [phone]

## 2020-09-17 NOTE — Progress Notes (Signed)
Internal Medicine Clinic Attending  Case discussed with Dr. Court Joy  At the time of the visit.  We reviewed the resident's history and exam and pertinent patient test results.  I agree with the assessment, diagnosis, and plan of care documented in the resident's note, including Dr. Lonzo Candy plan for coordination with GI Dr. Hilarie Fredrickson.  A number of conditions are associated with intestinal and joint manifestations; he is at this point quite affected by debilitating symptoms and a virtual rheumatology consultation (if w/u point in this direction) may be necessary if in-person appointments are significantly delayed.

## 2020-09-26 ENCOUNTER — Encounter: Payer: Self-pay | Admitting: Internal Medicine

## 2020-10-09 ENCOUNTER — Other Ambulatory Visit: Payer: Self-pay | Admitting: Internal Medicine

## 2020-10-09 ENCOUNTER — Encounter: Payer: Self-pay | Admitting: Internal Medicine

## 2020-10-09 MED ORDER — IBUPROFEN 600 MG PO TABS: 600.0000 mg | ORAL_TABLET | Freq: Four times a day (QID) | ORAL | 0 refills | Status: DC | PRN

## 2020-10-15 ENCOUNTER — Telehealth: Payer: Self-pay | Admitting: Internal Medicine

## 2020-10-15 ENCOUNTER — Other Ambulatory Visit: Payer: Self-pay | Admitting: Internal Medicine

## 2020-10-15 ENCOUNTER — Encounter: Payer: Self-pay | Admitting: Internal Medicine

## 2020-10-15 LAB — SARS CORONAVIRUS 2 (TAT 6-24 HRS): SARS Coronavirus 2: NEGATIVE

## 2020-10-15 MED ORDER — SUTAB 1479-225-188 MG PO TABS: ORAL_TABLET | ORAL | 0 refills | Status: DC

## 2020-10-15 NOTE — Telephone Encounter (Signed)
Patient is scheduled to have procedure on 10/18/20 and has not received his prep medication.

## 2020-10-15 NOTE — Telephone Encounter (Signed)
Rx sent. Patient advised.

## 2020-10-18 ENCOUNTER — Other Ambulatory Visit (INDEPENDENT_AMBULATORY_CARE_PROVIDER_SITE_OTHER): Payer: 59

## 2020-10-18 ENCOUNTER — Ambulatory Visit (AMBULATORY_SURGERY_CENTER): Payer: 59 | Admitting: Internal Medicine

## 2020-10-18 ENCOUNTER — Encounter: Payer: Self-pay | Admitting: Internal Medicine

## 2020-10-18 ENCOUNTER — Other Ambulatory Visit: Payer: Self-pay

## 2020-10-18 ENCOUNTER — Other Ambulatory Visit: Payer: Self-pay | Admitting: Internal Medicine

## 2020-10-18 VITALS — BP 146/96 | HR 65 | Temp 98.0°F | Resp 26 | Ht 73.0 in | Wt 262.0 lb

## 2020-10-18 DIAGNOSIS — K529 Noninfective gastroenteritis and colitis, unspecified: Secondary | ICD-10-CM

## 2020-10-18 DIAGNOSIS — K2970 Gastritis, unspecified, without bleeding: Secondary | ICD-10-CM | POA: Diagnosis not present

## 2020-10-18 DIAGNOSIS — D500 Iron deficiency anemia secondary to blood loss (chronic): Secondary | ICD-10-CM

## 2020-10-18 DIAGNOSIS — R197 Diarrhea, unspecified: Secondary | ICD-10-CM

## 2020-10-18 DIAGNOSIS — K50818 Crohn's disease of both small and large intestine with other complication: Secondary | ICD-10-CM | POA: Diagnosis not present

## 2020-10-18 DIAGNOSIS — B9681 Helicobacter pylori [H. pylori] as the cause of diseases classified elsewhere: Secondary | ICD-10-CM | POA: Diagnosis not present

## 2020-10-18 LAB — C-REACTIVE PROTEIN: CRP: 2.9 mg/dL (ref 0.5–20.0)

## 2020-10-18 MED ORDER — SODIUM CHLORIDE 0.9 % IV SOLN
500.0000 mL | Freq: Once | INTRAVENOUS | Status: DC
Start: 2020-10-18 — End: 2020-10-22

## 2020-10-18 MED ORDER — PREDNISONE 10 MG PO TABS: ORAL_TABLET | ORAL | 0 refills | Status: DC

## 2020-10-18 NOTE — Op Note (Signed)
[place] Patient Name: Jonathan Drake Procedure Date: 10/18/2020 3:09 PM MRN: [id number] Endoscopist: Jerene Bears , MD Age: 40 Referring MD:  Date of Birth: [birth year] Gender: Male Account #: [id number] Procedure:                Colonoscopy Indications:              Clinically significant bloody diarrhea of                            unexplained origin Medicines:                Monitored Anesthesia Care Procedure:                Pre-Anesthesia Assessment:                           - Prior to the procedure, a History and Physical                            was performed, and patient medications and                            allergies were reviewed. The patient's tolerance of                            previous anesthesia was also reviewed. The risks                            and benefits of the procedure and the sedation                            options and risks were discussed with the patient.                            All questions were answered, and informed consent                            was obtained. Prior Anticoagulants: The patient has                            taken no previous anticoagulant or antiplatelet                            agents. ASA Grade Assessment: II - A patient with                            mild systemic disease. After reviewing the risks                            and benefits, the patient was deemed in                            satisfactory condition to undergo the procedure.  After obtaining informed consent, the colonoscope                            was passed under direct vision. Throughout the                            procedure, the patient's blood pressure, pulse, and                            oxygen saturations were monitored continuously. The                            Olympus CF-HQ190L [id number]) Colonoscope was                            introduced through the anus and advanced to the 15                             cm into the ileum. The colonoscopy was performed                            without difficulty. The patient tolerated the                            procedure well. The quality of the bowel                            preparation was excellent. The terminal ileum,                            ileocecal valve, appendiceal orifice, and rectum                            were photographed. Scope In: 3:25:03 PM Scope Out: 3:42:29 PM Scope Withdrawal Time: 0 hours 12 minutes 59 seconds  Total Procedure Duration: 0 hours 17 minutes 26 seconds  Findings:                 The perianal exam findings include an area of                            ulcerated skin superior to the anal canal in the                            intergluteal cleft.                           The digital rectal exam findings include granular                            tissue with scarring and mild anal stenosis. There                            was also a probable perianal fistulous opening  which was not draining and without fluctuance.                           The terminal ileum appeared normal.                           Inflammation characterized by congestion (edema),                            erosions, loss of vascularity, scarring, deep                            ulcerations and shallow ulcerations was found as                            small patches surrounded by normal mucosa in the                            transverse colon, in the ascending colon and at the                            cecum, as medium patches surrounded by normal                            mucosa in the descending colon and as large patches                            surrounded by normal mucosa in the sigmoid colon.                            The majority of the rectum is spared though there                            is circumferential inflammation in the anal canal                            and extending  3 or 4 cm past the dentate line. This                            was moderate in severity, except in the sigmoid                            where it was severe. Biopsies were taken with a                            cold forceps for histology (jar 1 cecum and                            ascending colon, jar 2 transverse colon, jar 3                            descending and sigmoid colon, jar 4 rectum).  No additional abnormalities were found on                            retroflexion. Complications:            No immediate complications. Estimated Blood Loss:     Estimated blood loss was minimal. Impression:               - Perianal ulceration at the intergluteal cleft                            found on perianal exam.                           - Perianal Crohn's disease with mild anal stenosis.                           - The examined portion of the ileum was normal.                           - Crohn's disease with colonic involvement. Patchy                            inflammation in the cecum, ascending colon and                            transverse colon which is mild to moderate. Severe                            inflammation with larger patches and segments of                            ulcerated mucosa in the descending and sigmoid                            colon and distal rectum/anal canal. Biopsied. Recommendation:           - Patient has a contact number available for                            emergencies. The signs and symptoms of potential                            delayed complications were discussed with the                            patient. Return to normal activities tomorrow.                            Written discharge instructions were provided to the                            patient.                           - Resume previous diet.                           -  Continue present medications. Anticipate starting                             prednisone taper though I have been in conversation                            with Dr. Benjamine Mola who will see the patient next week in                            rheumatology clinic to evaluate inflammatory                            arthropathy. Dr. Benjamine Mola preferred that we hold                            steroids until after his initial visit in                            rheumatology. After rheumatology visit I recommend                            starting prednisone 40 mg x7 days and decreasing by                            5 mg every 7 days until off. Biologic therapy is                            recommended likely with Remicade versus Humira.                            Check hepatitis B and C serology, check QuantiFERON                            gold.                           - No aspirin, ibuprofen, naproxen, or other                            non-steroidal anti-inflammatory drugs.                           - Await pathology results.                           - No recommendation at this time regarding repeat                            colonoscopy.                           - Clinic visit with me in 4-6 weeks. Jerene Bears, MD 10/18/2020 4:09:16 PM This report has been signed electronically.

## 2020-10-18 NOTE — Progress Notes (Signed)
pt tolerated well. VSS. awake and to recovery. Report given to RN. Bite block left insitu to recovery. No trauma.

## 2020-10-18 NOTE — Progress Notes (Signed)
Called to room to assist during endoscopic procedure.  Patient ID and intended procedure confirmed with present staff. Received instructions for my participation in the procedure from the performing physician.  

## 2020-10-18 NOTE — Progress Notes (Signed)
Bite block inserted by Tech while pt awake and confirms comfort.

## 2020-10-18 NOTE — Patient Instructions (Addendum)
LABWORK ORDERED BY DR PYRTLE TO  BE DONE ON DISCHARGE TODAY  PREDNISONE DECREASING DOSAGES ORDERED - TO BEGIN AFTER RHEUMATOLGY VISIT  NO ASPIRIN,IBUPROFEN, NAPROXEN,OR OTHER NON STEROIDAL ANTI INFLAMMATORY DRUGS  MAKE APPOINTMENT TO SEE DR PYRTLE IN OFFICE IN 4-6 WEEKS    Upper Endoscopy:   Handout on gastritis given to you today  Await biopsy results   Colonoscopy:   Await biopsy results    YOU HAD AN ENDOSCOPIC PROCEDURE TODAY AT [place]:   Refer to the procedure report that was given to you for any specific questions about what was found during the examination.  If the procedure report does not answer your questions, please call your gastroenterologist to clarify.  If you requested that your care partner not be given the details of your procedure findings, then the procedure report has been included in a sealed envelope for you to review at your convenience later.  YOU SHOULD EXPECT: Some feelings of bloating in the abdomen. Passage of more gas than usual.  Walking can help get rid of the air that was put into your GI tract during the procedure and reduce the bloating. If you had a lower endoscopy (such as a colonoscopy or flexible sigmoidoscopy) you may notice spotting of blood in your stool or on the toilet paper. If you underwent a bowel prep for your procedure, you may not have a normal bowel movement for a few days.  Please Note:  You might notice some irritation and congestion in your nose or some drainage.  This is from the oxygen used during your procedure.  There is no need for concern and it should clear up in a day or so.  SYMPTOMS TO REPORT IMMEDIATELY:   Following lower endoscopy (colonoscopy or flexible sigmoidoscopy):  Excessive amounts of blood in the stool  Significant tenderness or worsening of abdominal pains  Swelling of the abdomen that is new, acute  Fever of 100F or higher   Following upper endoscopy (EGD)  Vomiting of blood or  coffee ground material  New chest pain or pain under the shoulder blades  Painful or persistently difficult swallowing  New shortness of breath  Fever of 100F or higher  Black, tarry-looking stools  For urgent or emergent issues, a gastroenterologist can be reached at any hour by calling [phone]. Do not use MyChart messaging for urgent concerns.    DIET:  We do recommend a small meal at first, but then you may proceed to your regular diet.  Drink plenty of fluids but you should avoid alcoholic beverages for 24 hours.  ACTIVITY:  You should plan to take it easy for the rest of today and you should NOT DRIVE or use heavy machinery until tomorrow (because of the sedation medicines used during the test).    FOLLOW UP: Our staff will call the number listed on your records 48-72 hours following your procedure to check on you and address any questions or concerns that you may have regarding the information given to you following your procedure. If we do not reach you, we will leave a message.  We will attempt to reach you two times.  During this call, we will ask if you have developed any symptoms of COVID 19. If you develop any symptoms (ie: fever, flu-like symptoms, shortness of breath, cough etc.) before then, please call [phone].  If you test positive for Covid 19 in the 2 weeks post procedure, please call and report this information to [place].  If any biopsies were taken you will be contacted by phone or by letter within the next 1-3 weeks.  Please call us at [phone] if you have not heard about the biopsies in 3 weeks.    SIGNATURES/CONFIDENTIALITY: You and/or your care partner have signed paperwork which will be entered into your electronic medical record.  These signatures attest to the fact that that the information above on your After Visit Summary has been reviewed and is understood.  Full responsibility of the confidentiality of this discharge information lies with you  and/or your care-partner.

## 2020-10-18 NOTE — Progress Notes (Signed)
Vitals-SF  History reviewed.

## 2020-10-18 NOTE — Op Note (Signed)
[place] Patient Name: Jonathan Drake Procedure Date: 10/18/2020 3:09 PM MRN: [id number] Endoscopist: Jerene Bears , MD Age: 40 Referring MD:  Date of Birth: 1981-08-09 Gender: Male Account #: [id number] Procedure:                Upper GI endoscopy Indications:              Iron deficiency anemia Medicines:                Monitored Anesthesia Care Procedure:                Pre-Anesthesia Assessment:                           - Prior to the procedure, a History and Physical                            was performed, and patient medications and                            allergies were reviewed. The patient's tolerance of                            previous anesthesia was also reviewed. The risks                            and benefits of the procedure and the sedation                            options and risks were discussed with the patient.                            All questions were answered, and informed consent                            was obtained. Prior Anticoagulants: The patient has                            taken no previous anticoagulant or antiplatelet                            agents. ASA Grade Assessment: II - A patient with                            mild systemic disease. After reviewing the risks                            and benefits, the patient was deemed in                            satisfactory condition to undergo the procedure.                           After obtaining informed consent, the endoscope was  passed under direct vision. Throughout the                            procedure, the patient's blood pressure, pulse, and                            oxygen saturations were monitored continuously. The                            Endoscope was introduced through the mouth, and                            advanced to the second part of duodenum. The upper                            GI endoscopy was accomplished without  difficulty.                            The patient tolerated the procedure well. Scope In: Scope Out: Findings:                 The examined esophagus was normal.                           Patchy mild inflammation characterized by erythema                            was found in the gastric body. Biopsies were taken                            with a cold forceps for histology and Helicobacter                            pylori testing.                           The examined duodenum was normal. Biopsies for                            histology were taken with a cold forceps for                            evaluation of celiac disease. Complications:            No immediate complications. Estimated Blood Loss:     Estimated blood loss was minimal. Impression:               - Normal esophagus.                           - Mild gastritis. Biopsied.                           - Normal examined duodenum. Biopsied. Recommendation:           - Patient has a contact number available for  emergencies. The signs and symptoms of potential                            delayed complications were discussed with the                            patient. Return to normal activities tomorrow.                            Written discharge instructions were provided to the                            patient.                           - Resume previous diet.                           - Continue present medications.                           - Await pathology results.                           - See the other procedure note for documentation of                            additional recommendations. Jerene Bears, MD 10/18/2020 3:54:19 PM This report has been signed electronically.

## 2020-10-18 NOTE — Progress Notes (Signed)
CALLED PHARMACIST AT [facility] AFTER PLACING PREDNISONE ORDER TO VERIFY CORRECT # OF PREDNISONE ORDERED .

## 2020-10-21 LAB — HEPATITIS B SURFACE ANTIGEN: Hepatitis B Surface Ag: NONREACTIVE

## 2020-10-21 LAB — HEPATITIS C ANTIBODY
Hepatitis C Ab: NONREACTIVE
SIGNAL TO CUT-OFF: 0.07 (ref <1.00)

## 2020-10-21 LAB — HEPATITIS B SURFACE ANTIBODY,QUALITATIVE: Hep B S Ab: NONREACTIVE

## 2020-10-21 LAB — HEPATITIS B CORE ANTIBODY, TOTAL: Hep B Core Total Ab: NONREACTIVE

## 2020-10-21 NOTE — Progress Notes (Signed)
Office Visit Note  Patient: Jonathan Drake             Date of Birth: June 07, 1981           MRN: [id number]             PCP: Delice Bison, DO Referring: Lucious Groves, DO Visit Date: 10/22/2020 Occupation: Games developer  Subjective:  New Patient (Initial Visit) (Patient complains of bilateral hand pain, swelling, and stiffness. Previously patient had lower extremity pain, which seems to be resolved now. )   History of Present Illness: Jonathan Drake is a 40 y.o. 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 His symptoms started approximately 3 months ago, and also has weight loss during this period. He also has diarrhea and blood in stools since at least about 6-8 months ago, and recently underwent colonoscopy by Dr. Hilarie Fredrickson for iron deficiency anemia and findings were consistent with crohn's disease. He notices his initial symptoms in the knees an feet improved somewhat but now has predominantly complaints in his upper extremities with elbow, wrist, and multiple fingers involved. Xrays of the ankles and right hand in January showed swelling with no erosive disease or demineralization. He takes tylenol with partial benefit. He was taking ibuprofen but stopped this as directed. He has been unable to work as normal on account of his symptoms. He felt some eye soreness around when everything started but no current complaint and no vision change. He denies any low back pain.  Labs reviewed 10/2020 HBV neg HCV neg TB neg CRP 2.9  08/2020 ESR wnl CRP 132 RF 14.1   Activities of Daily Living:  Patient reports morning stiffness for several hours.   Patient Denies nocturnal pain.  Difficulty dressing/grooming: Reports Difficulty climbing stairs: Denies Difficulty getting out of chair: Reports Difficulty using hands for taps, buttons, cutlery, and/or writing: Reports  Review of Systems  Constitutional: Negative for fatigue.   HENT: Negative for mouth sores, mouth dryness and nose dryness.   Eyes: Negative for pain, itching, visual disturbance and dryness.  Respiratory: Negative for cough, hemoptysis, shortness of breath and difficulty breathing.   Cardiovascular: Negative for chest pain, palpitations and swelling in legs/feet.  Gastrointestinal: Positive for blood in stool. Negative for abdominal pain, constipation and diarrhea.  Endocrine: Negative for increased urination.  Genitourinary: Negative for painful urination.  Musculoskeletal: Positive for arthralgias, joint pain, joint swelling and morning stiffness. Negative for myalgias, muscle weakness, muscle tenderness and myalgias.  Skin: Negative for color change, rash and redness.  Allergic/Immunologic: Negative for susceptible to infections.  Neurological: Negative for dizziness, numbness, headaches, memory loss and weakness.  Hematological: Negative for swollen glands.  Psychiatric/Behavioral: Negative for confusion and sleep disturbance.    PMFS History:  Patient Active Problem List   Diagnosis Date Noted  . Crohn's disease ([place]) 10/22/2020  . Arthritis associated with inflammatory bowel disease 10/22/2020  . High risk medication use 10/22/2020  . Sickle cell trait ([place]) 10/22/2020  . Polyarthralgia 08/16/2020  . Iron deficiency anemia 08/16/2020  . Hypertension 08/16/2020    Past Medical History:  Diagnosis Date  . Arthritis    pos RA marker  . Iron deficiency anemia   . Sickle cell anemia (HCC)    positive trait  . Sickle cell trait (HCC)     Family History  Problem Relation Age of Onset  . Diabetes Mother   . Aneurysm Mother   . CAD Father   .  Arrhythmia Brother        2 brothers with pacemakers   . Heart attack Other        uncle died of a heart attack at age 20  . Colon cancer Neg Hx   . Esophageal cancer Neg Hx   . Pancreatic cancer Neg Hx   . Ulcerative colitis Neg Hx    Past Surgical History:  Procedure Laterality Date   . NO PAST SURGERIES     Social History   Social History Narrative  . Not on file    There is no immunization history on file for this patient.   Objective: Vital Signs: BP 116/62 (BP Location: Left Arm, Patient Position: Sitting, Cuff Size: Normal)   Pulse 66   Ht 5' 10.5" (1.791 m)   Wt 261 lb 12.8 oz (118.8 kg)   BMI 37.03 kg/m    Physical Exam Constitutional:      Appearance: He is obese.  HENT:     Right Ear: External ear normal.     Left Ear: External ear normal.     Mouth/Throat:     Mouth: Mucous membranes are moist.     Pharynx: Oropharynx is clear.  Eyes:     Conjunctiva/sclera: Conjunctivae normal.  Cardiovascular:     Rate and Rhythm: Normal rate and regular rhythm.  Pulmonary:     Effort: Pulmonary effort is normal.     Breath sounds: Normal breath sounds.  Skin:    General: Skin is warm and dry.     Findings: No rash.  Neurological:     General: No focal deficit present.     Mental Status: He is alert.  Psychiatric:        Mood and Affect: Mood normal.     Musculoskeletal Exam:  Neck full ROM no tenderness Shoulders full ROM no tenderness or swelling Elbows left restricted to about 150 degrees extension with swelling and tenderness, right normal Wrists swelling and tenderness with reduced ROM b/l Fingers swelling in 2-3rd right MCPs and 2nd PIP,left 2nd MCP is swollen and tender and reduced grip tightness Knees full ROM no tenderness or swelling Ankles reduced ROM, swelling and tenderness at achilles insertion and below lateral malleolus Several right toes diffusely swollen   CDAI Exam: CDAI Score: 22  Patient Global: 50 mm; Provider Global: 60 mm Swollen: 9 ; Tender: 6  Joint Exam 10/22/2020      Right  Left  Elbow     Swollen Tender  Wrist  Swollen Tender  Swollen Tender  MCP 2  Swollen   Swollen Tender  MCP 3  Swollen      PIP 2  Swollen      Ankle  Swollen Tender  Swollen Tender     Investigation: No additional  findings.  Imaging: No results found.  Recent Labs: Lab Results  Component Value Date   WBC 7.0 07/31/2020   HGB 11.1 (L) 07/31/2020   PLT 418 (H) 07/31/2020   NA 138 08/29/2020   K 3.9 08/29/2020   CL 97 08/29/2020   CO2 27 08/29/2020   GLUCOSE 113 (H) 08/29/2020   BUN 7 08/29/2020   CREATININE 0.72 (L) 08/29/2020   BILITOT 0.4 08/15/2020   ALKPHOS 55 08/15/2020   AST 13 08/15/2020   ALT 17 08/15/2020   PROT 8.3 08/15/2020   ALBUMIN 3.3 (L) 08/15/2020   CALCIUM 9.4 08/29/2020   GFRAA 138 08/29/2020    Speciality Comments: No specialty comments available.  Procedures:  No procedures performed Allergies: Patient has no known allergies.   Assessment / Plan:     Visit Diagnoses: Arthritis associated with inflammatory bowel disease - Plan: Ambulatory referral to Ophthalmology  Pattern of joint involvement is bilateral, mostly symmetric, and polyarticular consistent with type 2 IBD associated arthritis. This is generally not self limited and often continues regardless of underlying disease activity. TNF inhibitor MAb treatment for this as preferred choice, and prednisone should improve symptoms short term. Will also refer for ophthalmology exam to rule out ocular involvement, less likely since doesn't have axial disease complaints.  Crohn's disease of small intestine without complication (HCC) Iron deficiency anemia Sickle cell trait  New diagnosis recently identified with moderate to severe disease in large intestine by recent colonoscopy. He is following up with Dr. Hilarie Fredrickson for colon inflammation and iron deficiency, possibly worsened due to sickle cell trait history with microcytosis.  High risk medication use  Discussed risks and benefits of treatment options including steroids and Humira. Discussed injection site reactions, infection risk, cytopenia risk, and long term increased risk of malignancy. He has recent CBC, CMP, and negative hepatitis and TB tests. No history  of recurrent infection or cancers. He has hypertension but well controlled on HCTZ and no diabetes.  Orders: Orders Placed This Encounter  Procedures  . Ambulatory referral to Ophthalmology   No orders of the defined types were placed in this encounter.   Follow-Up Instructions: No follow-ups on file.   Collier Salina, MD  Note - This record has been created using [employer].  Chart creation errors have been sought, but may not always  have been located. Such creation errors do not reflect on  the standard of medical care.

## 2020-10-22 ENCOUNTER — Ambulatory Visit (INDEPENDENT_AMBULATORY_CARE_PROVIDER_SITE_OTHER): Payer: 59 | Admitting: Internal Medicine

## 2020-10-22 ENCOUNTER — Encounter: Payer: Self-pay | Admitting: Internal Medicine

## 2020-10-22 ENCOUNTER — Other Ambulatory Visit: Payer: Self-pay

## 2020-10-22 VITALS — BP 116/62 | HR 66 | Ht 70.5 in | Wt 261.8 lb

## 2020-10-22 DIAGNOSIS — K5000 Crohn's disease of small intestine without complications: Secondary | ICD-10-CM

## 2020-10-22 DIAGNOSIS — D573 Sickle-cell trait: Secondary | ICD-10-CM | POA: Diagnosis not present

## 2020-10-22 DIAGNOSIS — K639 Disease of intestine, unspecified: Secondary | ICD-10-CM

## 2020-10-22 DIAGNOSIS — M0760 Enteropathic arthropathies, unspecified site: Secondary | ICD-10-CM

## 2020-10-22 DIAGNOSIS — Z79899 Other long term (current) drug therapy: Secondary | ICD-10-CM | POA: Diagnosis not present

## 2020-10-22 DIAGNOSIS — K5090 Crohn's disease, unspecified, without complications: Secondary | ICD-10-CM | POA: Insufficient documentation

## 2020-10-22 NOTE — Patient Instructions (Signed)
Prednisone tablets What is this medicine? PREDNISONE (PRED ni sone) is a corticosteroid. It is commonly used to treat inflammation of the skin, joints, lungs, and other organs. Common conditions treated include asthma, allergies, and arthritis. It is also used for other conditions, such as blood disorders and diseases of the adrenal glands. This medicine may be used for other purposes; ask your health care provider or pharmacist if you have questions. COMMON BRAND NAME(S): Deltasone, Predone, Sterapred, Sterapred DS What should I tell my health care provider before I take this medicine? They need to know if you have any of these conditions:  Cushing's syndrome  diabetes  glaucoma  heart disease  high blood pressure  infection (especially a virus infection such as chickenpox, cold sores, or herpes)  kidney disease  liver disease  mental illness  myasthenia gravis  osteoporosis  seizures  stomach or intestine problems  thyroid disease  an unusual or allergic reaction to lactose, prednisone, other medicines, foods, dyes, or preservatives  pregnant or trying to get pregnant  breast-feeding How should I use this medicine? Take this medicine by mouth with a glass of water. Follow the directions on the prescription label. Take this medicine with food. If you are taking this medicine once a day, take it in the morning. Do not take more medicine than you are told to take. Do not suddenly stop taking your medicine because you may develop a severe reaction. Your doctor will tell you how much medicine to take. If your doctor wants you to stop the medicine, the dose may be slowly lowered over time to avoid any side effects. Talk to your pediatrician regarding the use of this medicine in children. Special care may be needed. Overdosage: If you think you have taken too much of this medicine contact a poison control center or emergency room at once. NOTE: This medicine is only for you.  Do not share this medicine with others. What if I miss a dose? If you miss a dose, take it as soon as you can. If it is almost time for your next dose, talk to your doctor or health care professional. You may need to miss a dose or take an extra dose. Do not take double or extra doses without advice. What may interact with this medicine? Do not take this medicine with any of the following medications:  metyrapone  mifepristone This medicine may also interact with the following medications:  aminoglutethimide  amphotericin B  aspirin and aspirin-like medicines  barbiturates  certain medicines for diabetes, like glipizide or glyburide  cholestyramine  cholinesterase inhibitors  cyclosporine  digoxin  diuretics  ephedrine  male hormones, like estrogens and birth control pills  isoniazid  ketoconazole  NSAIDS, medicines for pain and inflammation, like ibuprofen or naproxen  phenytoin  rifampin  toxoids  vaccines  warfarin This list may not describe all possible interactions. Give your health care provider a list of all the medicines, herbs, non-prescription drugs, or dietary supplements you use. Also tell them if you smoke, drink alcohol, or use illegal drugs. Some items may interact with your medicine. What should I watch for while using this medicine? Visit your doctor or health care professional for regular checks on your progress. If you are taking this medicine over a prolonged period, carry an identification card with your name and address, the type and dose of your medicine, and your doctor's name and address. This medicine may increase your risk of getting an infection. Tell your doctor or  health care professional if you are around anyone with measles or chickenpox, or if you develop sores or blisters that do not heal properly. If you are going to have surgery, tell your doctor or health care professional that you have taken this medicine within the last  twelve months. Ask your doctor or health care professional about your diet. You may need to lower the amount of salt you eat. This medicine may increase blood sugar. Ask your healthcare provider if changes in diet or medicines are needed if you have diabetes. What side effects may I notice from receiving this medicine? Side effects that you should report to your doctor or health care professional as soon as possible:  allergic reactions like skin rash, itching or hives, swelling of the face, lips, or tongue  changes in emotions or moods  changes in vision  depressed mood  eye pain  fever or chills, cough, sore throat, pain or difficulty passing urine  signs and symptoms of high blood sugar such as being more thirsty or hungry or having to urinate more than normal. You may also feel very tired or have blurry vision.  swelling of ankles, feet Side effects that usually do not require medical attention (report to your doctor or health care professional if they continue or are bothersome):  confusion, excitement, restlessness  headache  nausea, vomiting  skin problems, acne, thin and shiny skin  trouble sleeping  weight gain This list may not describe all possible side effects. Call your doctor for medical advice about side effects. You may report side effects to FDA at 1-800-FDA-1088. Where should I keep my medicine? Keep out of the reach of children. Store at room temperature between 15 and 30 degrees C (59 and 86 degrees F). Protect from light. Keep container tightly closed. Throw away any unused medicine after the expiration date. NOTE: This sheet is a summary. It may not cover all possible information. If you have questions about this medicine, talk to your doctor, pharmacist, or health care provider.  2021 Elsevier/Gold Standard (2018-04-26 10:54:22)   Adalimumab Injection What is this medicine? ADALIMUMAB (ay da LIM yoo mab) is used to treat rheumatoid and psoriatic  arthritis. It is also used to treat ankylosing spondylitis, Crohn's disease, ulcerative colitis, plaque psoriasis, hidradenitis suppurativa, and uveitis. This medicine may be used for other purposes; ask your health care provider or pharmacist if you have questions. COMMON BRAND NAME(S): CYLTEZO, Humira What should I tell my health care provider before I take this medicine? They need to know if you have any of these conditions:  cancer  diabetes (high blood sugar)  having surgery  heart disease  hepatitis B  immune system problems  infections, such as tuberculosis (TB) or other bacterial, fungal, or viral infections  multiple sclerosis  recent or upcoming vaccine  an unusual reaction to adalimumab, mannitol, latex, rubber, other medicines, foods, dyes, or preservatives  pregnant or trying to get pregnant  breast-feeding How should I use this medicine? This medicine is for injection under the skin. You will be taught how to prepare and give it. Take it as directed on the prescription label. Keep taking it unless your health care provider tells you to stop. It is important that you put your used needles and syringes in a special sharps container. Do not put them in a trash can. If you do not have a sharps container, call your pharmacist or health care provider to get one. This medicine comes with INSTRUCTIONS FOR USE.  Ask your pharmacist for directions on how to use this medicine. Read the information carefully. Talk to your pharmacist or health care provider if you have questions. A special MedGuide will be given to you by the pharmacist with each prescription and refill. Be sure to read this information carefully each time. Talk to your pediatrician regarding the use of this medicine in children. While this drug may be prescribed for children as young as 2 years for selected conditions, precautions do apply. Overdosage: If you think you have taken too much of this medicine contact  a poison control center or emergency room at once. NOTE: This medicine is only for you. Do not share this medicine with others. What if I miss a dose? If you miss a dose, take it as soon as you can. If it is almost time for your next dose, take only that dose. Do not take double or extra doses. It is important not to miss any doses. Talk to your health care provider about what to do if you miss a dose. What may interact with this medicine? Do not take this medicine with any of the following medications:  abatacept  anakinra  biologic medicines such as certolizumab, etanercept, golimumab, infliximab  live virus vaccines This medicine may also interact with the following medications:  cyclosporine  theophylline  vaccines  warfarin This list may not describe all possible interactions. Give your health care provider a list of all the medicines, herbs, non-prescription drugs, or dietary supplements you use. Also tell them if you smoke, drink alcohol, or use illegal drugs. Some items may interact with your medicine. What should I watch for while using this medicine? Visit your health care provider for regular checks on your progress. Tell your health care provider if your symptoms do not start to get better or if they get worse. You will be tested for tuberculosis (TB) before you start this medicine. If your doctor prescribes any medicine for TB, you should start taking the TB medicine before starting this medicine. Make sure to finish the full course of TB medicine. This medicine may increase your risk of getting an infection. Call your health care provider for advice if you get a fever, chills, sore throat, or other symptoms of a cold or flu. Do not treat yourself. Try to avoid being around people who are sick. Talk to your health care provider about your risk of cancer. You may be more at risk for certain types of cancer if you take this medicine. What side effects may I notice from  receiving this medicine? Side effects that you should report to your doctor or health care professional as soon as possible:  allergic reactions like skin rash, itching or hives, swelling of the face, lips, or tongue  changes in vision  chest pain  dizziness  heart failure (trouble breathing; fast, irregular heartbeat; sudden weight gain; swelling of the ankles, feet, hands; unusually weak or tired)  infection (fever, chills, cough, sore throat, pain or trouble passing urine)  liver injury (dark yellow or brown urine; general ill feeling or flu-like symptoms; loss of appetite, right upper belly pain; unusually weak or tired, yellowing of the eyes or skin)  lump or swollen lymph nodes on the neck, groin, or underarm area  muscle weakness  pain, tingling, numbness in the hands or feet  red, scaly patches or raised bumps on the skin  trouble breathing  unusual bleeding or bruising  unusually weak or tired Side effects that usually  do not require medical attention (report to your doctor or health care professional if they continue or are bothersome):  headache  nausea  pain, redness, or irritation at site where injected  stuffy or runny nose This list may not describe all possible side effects. Call your doctor for medical advice about side effects. You may report side effects to FDA at 1-800-FDA-1088. Where should I keep my medicine? Keep out of the reach of children and pets. Store in the refrigerator between 2 and 8 degrees C (36 and 46 degrees F). Do not freeze. Keep this medicine in the original packaging until you are ready to take it. Protect from light. Get rid of any unused medicine after the expiration date. This medicine may be stored at room temperature for up to 14 days. Keep this medicine in the original packaging. Protect from light. If it is stored at room temperature, get rid of any unused medicine after 14 days or after it expires, whichever is first. To get  rid of medicines that are no longer needed or have expired:  Take the medicine to a medicine take-back program. Check with your pharmacy or law enforcement to find a location.  If you cannot return the medicine, ask your pharmacist or health care provider how to get rid of this medicine safely. NOTE: This sheet is a summary. It may not cover all possible information. If you have questions about this medicine, talk to your doctor, pharmacist, or health care provider.  2021 Elsevier/Gold Standard (2019-10-05 17:28:40)

## 2020-10-23 ENCOUNTER — Telehealth: Payer: Self-pay

## 2020-10-23 NOTE — Telephone Encounter (Signed)
  Follow up Call-  Call back number 10/18/2020  Post procedure Call Back phone  # [phone]  Permission to leave phone message Yes  Some recent data might be hidden     Patient questions:  Do you have a fever, pain , or abdominal swelling? No. Pain Score  0 *  Have you tolerated food without any problems? Yes.    Have you been able to return to your normal activities? Yes.    Do you have any questions about your discharge instructions: Diet   No. Medications  No. Follow up visit  No.  Do you have questions or concerns about your Care? No.  Actions: * If pain score is 4 or above: No action needed, pain <4.  1. Have you developed a fever since your procedure? no  2.   Have you had an respiratory symptoms (SOB or cough) since your procedure? no  3.   Have you tested positive for COVID 19 since your procedure no  4.   Have you had any family members/close contacts diagnosed with the COVID 19 since your procedure?  no   If yes to any of these questions please route to Joylene John, RN and Joella Prince, RN

## 2020-10-24 ENCOUNTER — Telehealth: Payer: Self-pay | Admitting: Internal Medicine

## 2020-10-24 NOTE — Telephone Encounter (Signed)
Pt Is requesting a call back from a nurse to see how he is supposed to take the prednisone

## 2020-10-25 NOTE — Telephone Encounter (Signed)
Patient indicates that he spoke to the pharmacy which actually answered the question he had.

## 2020-10-30 ENCOUNTER — Other Ambulatory Visit: Payer: Self-pay

## 2020-10-30 ENCOUNTER — Telehealth: Payer: Self-pay | Admitting: Pharmacist

## 2020-10-30 DIAGNOSIS — K5000 Crohn's disease of small intestine without complications: Secondary | ICD-10-CM

## 2020-10-30 MED ORDER — OMEPRAZOLE 20 MG PO CPDR: 20.0000 mg | DELAYED_RELEASE_CAPSULE | Freq: Two times a day (BID) | ORAL | 0 refills | Status: DC

## 2020-10-30 MED ORDER — BIS SUBCIT-METRONID-TETRACYC 140-125-125 MG PO CAPS: 3.0000 | ORAL_CAPSULE | Freq: Three times a day (TID) | ORAL | 0 refills | Status: DC

## 2020-10-30 NOTE — Telephone Encounter (Addendum)
Please start Humira BIV.  Dose: 185m SQ on Day 0,  823mon Week 2, then 406mevery other week beginning Week 4  Dx: K50.00  Previously tried therapies: naive to biologics  Currently taking prednisone for short-term management    ----- Message from [name] sent at 10/30/2020  1:17 PM EDT ----- Regarding: New Humira Rx [name] recommended to start Humira 39m43m q14days for Crohn's disease and IBD associated arthritis. Baseline labs were checked most recent in GI clinic visit 10/18/20. His TB test was not drawn for some reason but is ordered and he plans to get it this week.

## 2020-10-31 ENCOUNTER — Other Ambulatory Visit: Payer: Self-pay | Admitting: *Deleted

## 2020-10-31 DIAGNOSIS — K529 Noninfective gastroenteritis and colitis, unspecified: Secondary | ICD-10-CM

## 2020-10-31 NOTE — Telephone Encounter (Signed)
Submitted a Prior Authorization request to [facility] for HUMIRA via Cover My Meds. Will update once we receive a response.   KeyKatheren Shams - PA Case ID: [id number]   Added patient to complete pro- awaiting BIV

## 2020-11-01 NOTE — Telephone Encounter (Signed)
Received notification from [facility] regarding a prior authorization for HUMIRA 40mg  pen. Authorization has been APPROVED from 10/31/20 to 05/02/21.   Authorization # [id number] Phone # [phone]  Patient must use [employer] (likely [facility])  Submitted [facility] for Humira 80mg  pens for CD starter pack over phone (160mg  on Day 0 and 80mg  on Day 14). No reference number per rep, Anticia L. Expected turnaround time is 24-72 hours.  Berkley Harvey, PharmD, MPH Clinical Pharmacist (Rheumatology and Pulmonology)

## 2020-11-02 ENCOUNTER — Other Ambulatory Visit: Payer: Self-pay | Admitting: Internal Medicine

## 2020-11-04 ENCOUNTER — Telehealth: Payer: Self-pay | Admitting: *Deleted

## 2020-11-04 ENCOUNTER — Other Ambulatory Visit: Payer: PRIVATE HEALTH INSURANCE

## 2020-11-04 DIAGNOSIS — K529 Noninfective gastroenteritis and colitis, unspecified: Secondary | ICD-10-CM

## 2020-11-04 NOTE — Telephone Encounter (Signed)
[employer] has approved patient's Pylera from 10/31/20-10/30/21. Prior Authorization reference number [id number].

## 2020-11-04 NOTE — Telephone Encounter (Signed)
Received notification from [facility] regarding a prior authorization for HUMIRA 80mg /0.23mL. Authorization has been APPROVED from 11/01/20 to 12/01/20 for 3 pens per 28 days for 1 fill.  We are awaiting TB gold test prior to starting Humira.  Left VM for patient regarding approval and that we need TB gold prior to start medication.  Authorization # [id number] Phone # [phone]  Patient approved for Humira loading dose and maintenance dose for Crohn's disease.

## 2020-11-06 LAB — QUANTIFERON-TB GOLD PLUS
Mitogen-NIL: >10 IU/mL
NIL: 0.04 IU/mL
QuantiFERON-TB Gold Plus: NEGATIVE
TB1-NIL: <0 IU/mL
TB2-NIL: <0 IU/mL

## 2020-11-06 NOTE — Telephone Encounter (Signed)
He has never been on injectable medicines like this so would offer in person start but he can decline if he wants or it would cause a significant delay.

## 2020-11-06 NOTE — Telephone Encounter (Signed)
Humira Copay card info:  [facility] Complete Savings Card  Rx [id number] Rx [phone] Rx [id number] Rx PCN-OHCP

## 2020-11-07 MED ORDER — HUMIRA-CD/UC/HS STARTER 80 MG/0.8ML ~~LOC~~ AJKT: AUTO-INJECTOR | SUBCUTANEOUS | 0 refills | Status: DC

## 2020-11-07 NOTE — Telephone Encounter (Signed)
Rx for Humira CD loading dose sent to [facility]. Called patient to notify and advised that he needs to call pharmacy to provide consent and pay copay.  Sent MyChart message with pharmacy phone number, copay card information.  Once medication is delivered to the clinic, we can schedule him onto Dr. Marveen Reeks schedule for new start  Knox Saliva, PharmD, MPH Clinical Pharmacist (Rheumatology and Pulmonology)

## 2020-11-11 NOTE — Telephone Encounter (Signed)
Called [employer] to check on prescription. Rep states they have rx and it is getting processed through copay assistance. They will then call patient for Shipment consent and ship to the office. Will follow up.

## 2020-11-13 NOTE — Telephone Encounter (Signed)
Called [employer] to check on patient's loading dose shipment. Rep advised they are still working on copay assistance.  Called [employer] to see if they could reach out to pharmacy. They conference called pharmacy and advised them they need to contact the copay card company to obtain the 1 time debit card.  [employer] rep states they will mark Urgent and get shipment scheduled in the next 24 hours. Will follow up.  [employer]# [phone]

## 2020-11-15 ENCOUNTER — Telehealth: Payer: Self-pay

## 2020-11-15 NOTE — Telephone Encounter (Addendum)
Patient's copay is $0 however pharmacy still needs consent from patient to ship to clinic. They left VM for patient and I have reached out as well. Left VM detailing that he needs to provide consent to ship med to clinic and schedule new start.  Provided [place] phone number [phone]).  Will f/u next week.  Knox Saliva, PharmD, MPH Clinical Pharmacist (Rheumatology and Pulmonology)   Addendum: [facility] able to get patient's consent to ship to the clinic.  Humira PEN-CD/UC/HS Starter 80 mg/0.8 ml will be delivered by UPS to our office on Tuesday, 11/19/20.  If it is not received within 24 hours of delivery date, please call [phone]  Will schedule new start visit once shipment is received

## 2020-11-15 NOTE — Telephone Encounter (Signed)
Jonathan Drake from [facility] called stating patient's Humira PEN-CD/UC/HS Starter 80 mg/0.8 ml will be delivered by UPS to our office on Tuesday, 11/19/20.  If it is not received within 24 hours of delivery date, please call [phone]

## 2020-11-19 NOTE — Telephone Encounter (Signed)
If you have availability that would be great otherwise I can see.

## 2020-11-19 NOTE — Telephone Encounter (Signed)
Shipment of Humira CD Starter received from [facility].

## 2020-11-20 ENCOUNTER — Ambulatory Visit: Payer: PRIVATE HEALTH INSURANCE | Admitting: Pharmacist

## 2020-11-20 NOTE — Progress Notes (Deleted)
Pharmacy Note  Subjective:   Patient presents to clinic today to receive first dose of Humira for Crohn's disease.  He complete Pylera antibiotic course for H. Pylori 3 days ago (prescribed by Dr. Hilarie Fredrickson with GI). Dr. Hilarie Fredrickson wanted patient to start on Humira as soon as possible without need for stool eradication confirmation test.  Patient running a fever or have signs/symptoms of infection? {yes/no:20286}  Patient currently on antibiotics for the treatment of infection? {yes/no:20286}  Patient have any upcoming invasive procedures/surgeries? {yes/no:20286}  Objective: CMP     Component Value Date/Time   NA 138 08/29/2020 1407   K 3.9 08/29/2020 1407   CL 97 08/29/2020 1407   CO2 27 08/29/2020 1407   GLUCOSE 113 (H) 08/29/2020 1407   GLUCOSE 108 (H) 07/31/2020 1031   BUN 7 08/29/2020 1407   CREATININE 0.72 (L) 08/29/2020 1407   CALCIUM 9.4 08/29/2020 1407   PROT 8.3 08/15/2020 1446   ALBUMIN 3.3 (L) 08/15/2020 1446   AST 13 08/15/2020 1446   ALT 17 08/15/2020 1446   ALKPHOS 55 08/15/2020 1446   BILITOT 0.4 08/15/2020 1446   GFRNONAA 119 08/29/2020 1407   GFRNONAA >60 07/31/2020 1031   GFRAA 138 08/29/2020 1407    CBC    Component Value Date/Time   WBC 7.0 07/31/2020 1031   RBC 4.65 07/31/2020 1031   HGB 11.1 (L) 07/31/2020 1031   HCT 34.5 (L) 07/31/2020 1031   PLT 418 (H) 07/31/2020 1031   MCV 74.2 (L) 07/31/2020 1031   MCH 23.9 (L) 07/31/2020 1031   MCHC 32.2 07/31/2020 1031   RDW 15.5 07/31/2020 1031   LYMPHSABS 1.5 07/31/2020 1031   MONOABS 1.1 (H) 07/31/2020 1031   EOSABS 0.3 07/31/2020 1031   BASOSABS 0.0 07/31/2020 1031    Baseline Immunosuppressant Therapy Labs TB GOLD Quantiferon TB Gold Latest Ref Rng & Units 11/04/2020  Quantiferon TB Gold Plus NEGATIVE NEGATIVE   Hepatitis Panel Hepatitis Latest Ref Rng & Units 10/18/2020  Hep B Surface Ag NON-REACTI NON-REACTIVE  Hep C Ab NON-REACTI NON-REACTIVE  Hep C Ab NON-REACTI NON-REACTIVE   HIV No  results found for: HIV Immunoglobulins   SPEP Serum Protein Electrophoresis Latest Ref Rng & Units 08/15/2020  Total Protein 6.0 - 8.5 g/dL 8.3     Chest x-ray: ***  Assessment/Plan:  Demonstrated proper injection technique with Humira demo device  Patient able to demonstrate proper injection technique using the teach back method.  Patient self injected in the *** and *** with:  Pharmacy-Shipped Medication: *** NDC: *** Lot: *** Expiration: **  Patient tolerated well.  Observed for 30 mins in office for adverse reaction and ***.   Patient is to return in 1 month for labs and 6-8 weeks for follow-up appointment.  Standing orders placed.   Humira approved through insurance.  Prescription sent to [facility]. We used 2 Humira 47m pens today. Patient was sent home with one additional Humira 880mpen to complete loading dose in 14 days. [facility]very [facility].  Instructed patient to call with any further questions or concerns.  DevKnox SalivaharmD, MPH Clinical Pharmacist (Rheumatology and Pulmonology)  11/20/2020 4:51 PM

## 2020-11-20 NOTE — Patient Instructions (Addendum)
Your next Humira dose is due on 4/28 (this will be just one 62m pen). Then starting 5/11, you will take 457m(1 pen) every 14 days thereafter  [facility] [phone] call the pharmacy to schedule shipment and confirm address. They will mail medication to your home.  Your copay should be affordable. If you call the pharmacy and it is not affordable, please double-check that they are billing through your copay card as secondary coverage. That copay card information is: Copay card: [id number]  Remember the 5 C's:  COUNTER - leave on the counter at least 30 minutes but up to overnight to bring medication to room temperature. This may help prevent stinging  COLD - place something cold (like an ice gel pack or cold water bottle) on the injection site just before cleansing with alcohol. This may help reduce pain  CLARITIN - use Claritin (generic name is loratadine) for the first two weeks of treatment or the day of, the day before, and the day after injecting. This will help to minimize injection site reactions  CORTISONE CREAM - apply if injection site is irritated and itching  CALL ME - if injection site reaction is bigger than the size of your fist, looks infected, blisters, or if you develop hives

## 2020-11-20 NOTE — Telephone Encounter (Signed)
Patient's new start appointment for Humira cancelled today. Patient completed his Pylera abx course a couple of days ago per patient. I've reached out to Dr. Hilarie Fredrickson in regards to timing of Humira start and if he'd prefer to start it after repeat stool test to confirm H. Pylori clearance

## 2020-11-20 NOTE — Progress Notes (Signed)
Pharmacy Note  Subjective:   Jonathan Drake presents to clinic today to receive first dose of Humira for Crohn's disease.  Today, he states he did not take Pylera (per dispense report and per patient report, he took omeprazole, but don't see any antibiotics dispensed at same time. The dispense report may not adequately capture all prescriptions dispensed though).  He last took omeprazole 3-4 days ago (managed by Dr. Hilarie Fredrickson with GI).  Dr. Hilarie Fredrickson wanted patient to start on Humira as soon as possible.  He continues on prednisone taper as prescribed.  Patient running a fever or have signs/symptoms of infection? no  Patient currently on antibiotics for the treatment of infection? no  Patient have any upcoming invasive procedures/surgeries? no  Objective: CMP     Component Value Date/Time   NA 138 08/29/2020 1407   K 3.9 08/29/2020 1407   CL 97 08/29/2020 1407   CO2 27 08/29/2020 1407   GLUCOSE 113 (H) 08/29/2020 1407   GLUCOSE 108 (H) 07/31/2020 1031   BUN 7 08/29/2020 1407   CREATININE 0.72 (L) 08/29/2020 1407   CALCIUM 9.4 08/29/2020 1407   PROT 8.3 08/15/2020 1446   ALBUMIN 3.3 (L) 08/15/2020 1446   AST 13 08/15/2020 1446   ALT 17 08/15/2020 1446   ALKPHOS 55 08/15/2020 1446   BILITOT 0.4 08/15/2020 1446   GFRNONAA 119 08/29/2020 1407   GFRNONAA >60 07/31/2020 1031   GFRAA 138 08/29/2020 1407    CBC    Component Value Date/Time   WBC 7.0 07/31/2020 1031   RBC 4.65 07/31/2020 1031   HGB 11.1 (L) 07/31/2020 1031   HCT 34.5 (L) 07/31/2020 1031   PLT 418 (H) 07/31/2020 1031   MCV 74.2 (L) 07/31/2020 1031   MCH 23.9 (L) 07/31/2020 1031   MCHC 32.2 07/31/2020 1031   RDW 15.5 07/31/2020 1031   LYMPHSABS 1.5 07/31/2020 1031   MONOABS 1.1 (H) 07/31/2020 1031   EOSABS 0.3 07/31/2020 1031   BASOSABS 0.0 07/31/2020 1031    Baseline Immunosuppressant Therapy Labs TB GOLD Quantiferon TB Gold Latest Ref Rng & Units 11/04/2020  Quantiferon TB Gold Plus NEGATIVE NEGATIVE    Hepatitis Panel Hepatitis Latest Ref Rng & Units 10/18/2020  Hep B Surface Ag NON-REACTI NON-REACTIVE  Hep C Ab NON-REACTI NON-REACTIVE  Hep C Ab NON-REACTI NON-REACTIVE    SPEP Serum Protein Electrophoresis Latest Ref Rng & Units 08/15/2020  Total Protein 6.0 - 8.5 g/dL 8.3     Assessment/Plan:  Counseled patient that Humira is a TNF blocking agent.  Counseled patient on purpose, proper use, and adverse effects of Humira.  Reviewed the most common adverse effects including infections, headache, and injection site reactions. Discussed that there is the possibility of an increased risk of malignancy but it is not well understood if this increased risk is due to the medication or the disease state.  Advised patient to get yearly dermatology exams due to risk of skin cancer. Dermatology referral placed today.  Counseled patient that Humira should be held prior to scheduled surgery.  Counseled patient to avoid live vaccines while on Humira.  Advised patient to get annual influenza vaccine and the pneumococcal vaccine as indicated.  Patient states he is unsure about Hep B vaccination status but will reach out to PCP, Dr. Koleen Distance to confirm. Has not received COVID19 vaccines yet but plans to do so. Discussed holding Humira for COVID19 vaccines.  Reviewed the importance of regular labs while on Humira therapy.  Standing orders placed.  Provided patient  with medication education material and answered all questions.  Patient consented to Humira.  Will upload consent into the media tab.  Reviewed storage instructions of Humira.    Demonstrated proper injection technique with Humira demo device  Patient able to demonstrate proper injection technique using the teach back method.  Patient self injected in the right and left abdomen with:  Pharmacy-Shipped Medication: Humira 38m/0.8mL pen x 2  Patient tolerated well.  Observed for 30 mins in office for adverse reaction. No injection site  reaction.  F/u appointment with Dr. RBenjamine Molascheduled on 01/20/21.  Standing orders for CBC and CMP placed.  Humira approved through insurance.  Prescription sent to [facility]- patient provided with pharmacy phone number and copay card information. We used 2 Humira 856mpens today (total 16088m Patient was sent home with one additional Humira 29m16mn to complete loading dose in 14 days (12/05/20). Rx for maintenance dose of Humira 40mg37mry 14 days [facility].  We discussed continuing prednisone taper as prescribed and not to abruptly discontinue. Patient expressed understanding.  Routing to Dr. PyrtlHilarie Fredricksonotify that patient never took Pylera treatment course.  All questions encouraged and answered.  Instructed patient to call with any further questions or concerns.  DevkiKnox SalivarmD, MPH Clinical Pharmacist (Rheumatology and Pulmonology)

## 2020-11-20 NOTE — Telephone Encounter (Addendum)
Patient r/s Humira new start to tomorrow 11/21/20 @ 9:30am.   ----- Message from Jerene Bears, MD sent at 11/20/2020  8:50 AM EDT ----- Regarding: RE: Humira Initiation The Humira can start ASAP.  Do not need to wait until abx complete. Thanks for the message JMP  ----- Message ----- From: Cassandria Anger, [facility] Sent: 11/20/2020   8:32 AM EDT To: Jerene Bears, MD, Collier Salina, MD Subject: Humira Initiation                              Hi Dr. Hilarie Fredrickson,  Dr. Benjamine Mola and I wanted to reach out regarding Mr. Cyr' Humira. He is approved to start it for his Crohn's. He just completed his Pylera therapy a couple of days ago. For your Crohn's patients, do you traditionally wait to start Humira until a repeat stool test confirms eradication or go ahead and start Humira once the antibiotic course is completed?   Thank you!  Knox Saliva, PharmD, MPH Clinical Pharmacist (Rheumatology and Pulmonology)

## 2020-11-21 ENCOUNTER — Ambulatory Visit: Payer: 59 | Admitting: Pharmacist

## 2020-11-21 ENCOUNTER — Other Ambulatory Visit: Payer: Self-pay

## 2020-11-21 DIAGNOSIS — Z79899 Other long term (current) drug therapy: Secondary | ICD-10-CM

## 2020-11-21 MED ORDER — HUMIRA (2 PEN) 40 MG/0.4ML ~~LOC~~ AJKT: 40.0000 mg | AUTO-INJECTOR | SUBCUTANEOUS | 0 refills | Status: DC

## 2020-11-26 ENCOUNTER — Telehealth: Payer: Self-pay

## 2020-11-26 ENCOUNTER — Other Ambulatory Visit: Payer: Self-pay

## 2020-11-26 MED ORDER — TETRACYCLINE HCL 500 MG PO CAPS: 500.0000 mg | ORAL_CAPSULE | Freq: Four times a day (QID) | ORAL | 0 refills | Status: DC

## 2020-11-26 MED ORDER — METRONIDAZOLE 250 MG PO TABS
250.0000 mg | ORAL_TABLET | Freq: Four times a day (QID) | ORAL | 0 refills | Status: DC
Start: 2020-11-26 — End: 2020-12-31

## 2020-11-26 MED ORDER — OMEPRAZOLE 20 MG PO CPDR: 20.0000 mg | DELAYED_RELEASE_CAPSULE | Freq: Two times a day (BID) | ORAL | 0 refills | Status: DC

## 2020-11-26 MED ORDER — BISMUTH 262 MG PO CHEW: 524.0000 mg | CHEWABLE_TABLET | Freq: Four times a day (QID) | ORAL | 0 refills | Status: DC

## 2020-11-26 NOTE — Telephone Encounter (Signed)
Ok would prescribe pylera or it's components again. Will need bid ppi during the antibiotic course Thanks

## 2020-11-26 NOTE — Telephone Encounter (Signed)
Spoke with pt and he knows to pick up the prescriptions from his pharmacy and to take them for 14 days until gone to treat the H pylori. He verbalized understanding.

## 2020-11-26 NOTE — Telephone Encounter (Signed)
Prescription for omeprazole and pylera were both sent to the pharmacy on 10/30/20. Spoke with pt and he said he took the antibiotic he got from the pharmacy that started with an O, that was omeprazole. Explained to pt this was not an antibiotic. Let pt know I would check with Dr. Hilarie Fredrickson and get back with him.   Pt states he only heard from pharmacy on the one script but they were both sent in at the same time.

## 2020-11-26 NOTE — Progress Notes (Signed)
Prescription for omeprazole and pylera were both sent to the pharmacy on 10/30/20. Spoke with pt and he said he took the antibiotic he got from the pharmacy that started with an O, that was omeprazole. Explained to pt this was not an antibiotic. Let pt know I would check with Dr. Hilarie Fredrickson and get back with him.

## 2020-12-03 NOTE — Telephone Encounter (Signed)
Spoke with pt and his questions were answered regarding the medication and possible side effects.

## 2020-12-03 NOTE — Telephone Encounter (Signed)
Inbound call from patient with questions about medication. Questions about times to take them and possible side effects as well. Best contact # [phone]

## 2020-12-15 ENCOUNTER — Other Ambulatory Visit: Payer: Self-pay | Admitting: Internal Medicine

## 2020-12-21 ENCOUNTER — Other Ambulatory Visit: Payer: Self-pay | Admitting: Internal Medicine

## 2020-12-27 ENCOUNTER — Encounter: Payer: Self-pay | Admitting: *Deleted

## 2020-12-31 ENCOUNTER — Ambulatory Visit (INDEPENDENT_AMBULATORY_CARE_PROVIDER_SITE_OTHER): Payer: 59 | Admitting: Internal Medicine

## 2020-12-31 ENCOUNTER — Other Ambulatory Visit (INDEPENDENT_AMBULATORY_CARE_PROVIDER_SITE_OTHER): Payer: 59

## 2020-12-31 ENCOUNTER — Encounter: Payer: Self-pay | Admitting: Internal Medicine

## 2020-12-31 VITALS — BP 122/76 | HR 70 | Ht 72.0 in | Wt 278.0 lb

## 2020-12-31 DIAGNOSIS — K5010 Crohn's disease of large intestine without complications: Secondary | ICD-10-CM

## 2020-12-31 DIAGNOSIS — M0760 Enteropathic arthropathies, unspecified site: Secondary | ICD-10-CM

## 2020-12-31 DIAGNOSIS — K50111 Crohn's disease of large intestine with rectal bleeding: Secondary | ICD-10-CM | POA: Diagnosis not present

## 2020-12-31 DIAGNOSIS — K639 Disease of intestine, unspecified: Secondary | ICD-10-CM

## 2020-12-31 LAB — COMPREHENSIVE METABOLIC PANEL
ALT: 16 U/L (ref 0–53)
AST: 16 U/L (ref 0–37)
Albumin: 4.2 g/dL (ref 3.5–5.2)
Alkaline Phosphatase: 35 U/L — ABNORMAL LOW (ref 39–117)
BUN: 8 mg/dL (ref 6–23)
CO2: 27 mEq/L (ref 19–32)
Calcium: 9.7 mg/dL (ref 8.4–10.5)
Chloride: 102 mEq/L (ref 96–112)
Creatinine, Ser: 1 mg/dL (ref 0.40–1.50)
GFR: 95.72 mL/min (ref >60.00)
Glucose, Bld: 89 mg/dL (ref 70–99)
Potassium: 4 mEq/L (ref 3.5–5.1)
Sodium: 139 mEq/L (ref 135–145)
Total Bilirubin: 0.4 mg/dL (ref 0.2–1.2)
Total Protein: 8.3 g/dL (ref 6.0–8.3)

## 2020-12-31 LAB — IBC + FERRITIN
Ferritin: 61.3 ng/mL (ref 22.0–322.0)
Iron: 35 ug/dL — ABNORMAL LOW (ref 42–165)
Saturation Ratios: 10.9 % — ABNORMAL LOW (ref 20.0–50.0)
Transferrin: 230 mg/dL (ref 212.0–360.0)

## 2020-12-31 LAB — CBC WITH DIFFERENTIAL/PLATELET
Basophils Absolute: 0 10*3/uL (ref 0.0–0.1)
Basophils Relative: 0.6 % (ref 0.0–3.0)
Eosinophils Absolute: 0.2 10*3/uL (ref 0.0–0.7)
Eosinophils Relative: 4.5 % (ref 0.0–5.0)
HCT: 41.3 % (ref 39.0–52.0)
Hemoglobin: 13.3 g/dL (ref 13.0–17.0)
Lymphocytes Relative: 36.1 % (ref 12.0–46.0)
Lymphs Abs: 1.8 10*3/uL (ref 0.7–4.0)
MCHC: 32.3 g/dL (ref 30.0–36.0)
MCV: 77.2 fl — ABNORMAL LOW (ref 78.0–100.0)
Monocytes Absolute: 1 10*3/uL (ref 0.1–1.0)
Monocytes Relative: 19.8 % — ABNORMAL HIGH (ref 3.0–12.0)
Neutro Abs: 2 10*3/uL (ref 1.4–7.7)
Neutrophils Relative %: 39 % — ABNORMAL LOW (ref 43.0–77.0)
Platelets: 233 10*3/uL (ref 150.0–400.0)
RBC: 5.34 Mil/uL (ref 4.22–5.81)
RDW: 20.6 % — ABNORMAL HIGH (ref 11.5–15.5)
WBC: 5 10*3/uL (ref 4.0–10.5)

## 2020-12-31 NOTE — Progress Notes (Signed)
Subjective:    Patient ID: Jonathan Drake, male    DOB: 04-07-1981, 40 y.o.   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    EGD/colon March 2022 -- diagnosis of colonic and perianal Crohn's disease.  Initial treatment -- steroids and Humira (March/April 2022).  H. Pylori gastritis.  Treated with Pylera.  He reports that he is feeling much better with the Humira 40 mg every 2 weeks.  He also completed his prednisone taper approximately 10 to 14 days ago.  He has noticed his stools are forming up and much less frequent.  Stools are now 1-2 times per day.  He is rarely seeing blood with wiping where previously he was having bloody diarrhea.  He is not having abdominal pain though this was never really a major component of his colitis.  He has noticed significant improvement in his joint pains.  There is still some stiffness in his bilateral hands which seem to return his prednisone went away but his joint pains overall remain improved.  He has no further leg pain.  The skin breakdown at the top of the intergluteal cleft has healed per patient.  He completed his antibiotics with twice daily PPI for H. pylori gastritis.  This was a problem 2 to 3 weeks ago.  He is now off of PPI.  He continues oral iron.  Injections with Humira are going well.  He switched to using his thighs rather than his abdomen as the seatbelt was causing some minor discomfort.  No injection site reactions.   Review of Systems As per HPI, otherwise negative  Current Medications, Allergies, Past Medical History, Past Surgical History, Family History and Social History were reviewed in [employer] record.     Objective:   Physical Exam Wt 278 lb (126.1 kg)   BMI 39.33 kg/m  Gen: awake, alert, NAD HEENT: anicteric, op clear CV: RRR, no  mrg Pulm: CTA b/l Abd: soft, NT/ND, +BS throughout Ext: no c/c/e Neuro: nonfocal      Assessment & Plan:  40 year old male with recent diagnosis of colonic Crohn's disease with IBD arthropathy, sickle cell trait who presents for follow-up.    1.  Crohn's colitis with perianal involvement --diagnosis March 2022 at colonoscopy; symptoms improving clinically with Humira.  We discussed Crohn's disease pathophysiology and natural history today.  He seems to be responding nicely but I explained that I would expect further improvement over the next 6 weeks or so.  Given that prednisone stopped less than 2 weeks ago I would like him to let me know if there is any setback in his colitis symptoms. -- Continue Humira 40 mg every 14 days -- Return office visit in 3 months; consider fecal calprotectin at that time as an assessment of disease activity -- Humira level at trough in about 6 weeks  2.  IBD related IDA --he has been on oral iron.  As we treat his Crohn's disease I would expect his anemia to improve.  It should be noted that he does have sickle cell trait. -- CBC, iron studies today; check CMP also  3.  IBD arthropathy --following with Dr. Benjamine Mola with rheumatology as well, symptoms improving with Humira.  Continue Humira as above  4.  H. pylori gastritis --treated with Pylera; check H. pylori stool antigen off  PPI in about 6 weeks  30 minutes total spent today including patient facing time, coordination of care, reviewing medical history/procedures/pertinent radiology studies, and documentation of the encounter.

## 2020-12-31 NOTE — Patient Instructions (Signed)
You have been scheduled for a follow up with Dr Hilarie Fredrickson on 03/31/21 at 10:10 am.  Your provider has requested that you go to the basement level for lab work before leaving today. Press "B" on the elevator. The lab is located at the first door on the left as you exit the elevator.  Your provider has requested that you go to the basement level for lab work 1 day before you are due for your Humira injection (about 6 weeks from now). Press "B" on the elevator. The lab is located at the first door on the left as you exit the elevator. You will also pick up your stool container at this time.  If you are age 82 or younger, your body mass index should be between 19-25. Your Body mass index is 37.7 kg/m. If this is out of the aformentioned range listed, please consider follow up with your Primary Care Provider.   Due to recent changes in healthcare laws, you may see the results of your imaging and laboratory studies on MyChart before your provider has had a chance to review them.  We understand that in some cases there may be results that are confusing or concerning to you. Not all laboratory results come back in the same time frame and the provider may be waiting for multiple results in order to interpret others.  Please give [place] 48 hours in order for your provider to thoroughly review all the results before contacting the office for clarification of your results.

## 2021-01-01 ENCOUNTER — Encounter: Payer: Self-pay | Admitting: Radiology

## 2021-01-08 ENCOUNTER — Telehealth: Payer: Self-pay

## 2021-01-08 NOTE — Telephone Encounter (Signed)
Called patient and let him know it is time to come into our lab and do a stool test for eradication of his H-Pylori. States he will be in Thurs. Or Fri.

## 2021-01-08 NOTE — Telephone Encounter (Signed)
-----   Message from Algernon Huxley, RN sent at 10/30/2020  3:35 PM EDT ----- Regarding: stool antigen Pt needs stool for h pylori

## 2021-01-11 ENCOUNTER — Other Ambulatory Visit: Payer: Self-pay | Admitting: Student

## 2021-01-17 ENCOUNTER — Encounter: Payer: Self-pay | Admitting: *Deleted

## 2021-01-19 NOTE — Progress Notes (Signed)
Office Visit Note  Patient: Jonathan Drake             Date of Birth: April 12, 1981           MRN: [id number]             PCP: Delice Bison, DO Referring: Delice Bison, DO Visit Date: 01/20/2021   Subjective:  Follow-up (Patient feels as if symptoms are well controlled on Humira. Patient complains of mild right hand stiffness, otherwise doing well. )   History of Present Illness: Jonathan Drake is a 40 y.o. male here for follow up of Crohn's disease associated inflammatory arthritis after starting Humira.  Since his last visit he tapered off all prednisone and is now on Humira for almost 2 months.  He feels symptoms are 80 to 90% improved still notices some soreness and stiffness in the right hand but complete resolution of the joint swelling.  He has not had any diarrhea stools or any blood in the stool since 1 month ago.  He has not noticed any issues with the medicine such as injection site reaction or rashes.    Review of Systems  Constitutional:  Negative for fatigue.  HENT:  Negative for mouth sores, mouth dryness and nose dryness.   Eyes:  Negative for pain, itching, visual disturbance and dryness.  Respiratory:  Negative for cough, hemoptysis, shortness of breath and difficulty breathing.   Cardiovascular:  Negative for chest pain, palpitations and swelling in legs/feet.  Gastrointestinal:  Negative for abdominal pain, blood in stool, constipation and diarrhea.  Endocrine: Negative for increased urination.  Genitourinary:  Negative for painful urination.  Musculoskeletal:  Negative for joint pain, joint pain, joint swelling, myalgias, muscle weakness, morning stiffness, muscle tenderness and myalgias.  Skin:  Negative for color change, rash and redness.  Allergic/Immunologic: Negative for susceptible to infections.  Neurological:  Negative for dizziness, numbness, headaches, memory loss and weakness.  Hematological:  Negative for swollen glands.   Psychiatric/Behavioral:  Negative for confusion and sleep disturbance.    PMFS History:  Patient Active Problem List   Diagnosis Date Noted   Crohn's disease ([place]) 10/22/2020   Arthritis associated with inflammatory bowel disease 10/22/2020   High risk medication use 10/22/2020   Sickle cell trait ([place]) 10/22/2020   Polyarthralgia 08/16/2020   Iron deficiency anemia 08/16/2020   Hypertension 08/16/2020    Past Medical History:  Diagnosis Date   Arthritis    pos RA marker   Colitis    Iron deficiency anemia    Sickle cell anemia ([facility])    positive trait   Sickle cell trait ([place])     Family History  Problem Relation Age of Onset   Diabetes Mother    Aneurysm Mother    CAD Father    Arrhythmia Brother        2 brothers with pacemakers    Heart attack Other        uncle died of a heart attack at age 38   Colon cancer Neg Hx    Esophageal cancer Neg Hx    Pancreatic cancer Neg Hx    Ulcerative colitis Neg Hx    Past Surgical History:  Procedure Laterality Date   NO PAST SURGERIES     Social History   Social History Narrative   Not on file    There is no immunization history on file for this patient.   Objective: Vital Signs: BP (!) 143/92 (BP Location: Left Arm, Patient  Position: Sitting, Cuff Size: Normal)   Pulse 69   Ht 6' (1.829 m)   Wt 287 lb 3.2 oz (130.3 kg)   BMI 38.95 kg/m    Physical Exam Eyes:     Conjunctiva/sclera: Conjunctivae normal.  Skin:    General: Skin is warm and dry.     Findings: No rash.  Neurological:     General: No focal deficit present.     Mental Status: He is alert.  Psychiatric:        Mood and Affect: Mood normal.     Musculoskeletal Exam:  Shoulders full ROM no tenderness or swelling Elbows full ROM no tenderness or swelling Wrists full ROM no tenderness or swelling Fingers no tenderness or swelling, very slightly decreased flexion extension range of motion in right hand Knees full ROM no tenderness or  swelling Ankles full ROM no tenderness or swelling   CDAI Exam: CDAI Score: 2  Patient Global: 10 mm; Provider Global: 10 mm Swollen: 0 ; Tender: 0  Joint Exam 01/20/2021   All documented joints were normal     Investigation: No additional findings.  Imaging: No results found.  Recent Labs: Lab Results  Component Value Date   WBC 5.0 12/31/2020   HGB 13.3 12/31/2020   PLT 233.0 12/31/2020   NA 139 12/31/2020   K 4.0 12/31/2020   CL 102 12/31/2020   CO2 27 12/31/2020   GLUCOSE 89 12/31/2020   BUN 8 12/31/2020   CREATININE 1.00 12/31/2020   BILITOT 0.4 12/31/2020   ALKPHOS 35 (L) 12/31/2020   AST 16 12/31/2020   ALT 16 12/31/2020   PROT 8.3 12/31/2020   ALBUMIN 4.2 12/31/2020   CALCIUM 9.7 12/31/2020   GFRAA 138 08/29/2020   QFTBGOLDPLUS NEGATIVE 11/04/2020    Speciality Comments: No specialty comments available.  Procedures:  No procedures performed Allergies: Patient has no known allergies.   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  We will plan to continue Humira 40 mg subcu every 2 weeks currently tolerating well and good efficacy.  We will plan to follow-up at 3 months for now since new start can space out if maintaining well.  Crohn's disease of small intestine without complication ([place])  Reports gastrointestinal symptoms well controlled since at least 1 month ago off steroids.  Following up with Dr. Hilarie Fredrickson.  High risk medication use - Plan: CBC with Differential/Platelet, COMPLETE METABOLIC PANEL WITH GFR  New start TNF inhibitor treatment can be associated with leukopenia or transaminase elevations checking CBC and CMP today. He has been referred to dermatology although new visit remains further  out.   Orders: Orders Placed This Encounter  Procedures   Sedimentation rate   CBC with Differential/Platelet   COMPLETE METABOLIC PANEL WITH GFR   No orders of the defined types were placed in this encounter.    Follow-Up Instructions: Return in about 3 months (around 04/22/2021) for IBD associated arthritis f/u.   Collier Salina, MD  Note - This record has been created using [employer].  Chart creation errors have been sought, but may not always  have been located. Such creation errors do not reflect on  the standard of medical care.

## 2021-01-20 ENCOUNTER — Encounter: Payer: Self-pay | Admitting: Internal Medicine

## 2021-01-20 ENCOUNTER — Other Ambulatory Visit: Payer: Self-pay

## 2021-01-20 ENCOUNTER — Ambulatory Visit (INDEPENDENT_AMBULATORY_CARE_PROVIDER_SITE_OTHER): Payer: 59 | Admitting: Internal Medicine

## 2021-01-20 VITALS — BP 143/92 | HR 69 | Ht 72.0 in | Wt 287.2 lb

## 2021-01-20 DIAGNOSIS — Z79899 Other long term (current) drug therapy: Secondary | ICD-10-CM

## 2021-01-20 DIAGNOSIS — M0760 Enteropathic arthropathies, unspecified site: Secondary | ICD-10-CM | POA: Diagnosis not present

## 2021-01-20 DIAGNOSIS — K5000 Crohn's disease of small intestine without complications: Secondary | ICD-10-CM | POA: Diagnosis not present

## 2021-01-20 DIAGNOSIS — K639 Disease of intestine, unspecified: Secondary | ICD-10-CM

## 2021-01-21 LAB — COMPLETE METABOLIC PANEL WITH GFR
AG Ratio: 1.1 (calc) (ref 1.0–2.5)
ALT: 13 U/L (ref 9–46)
AST: 14 U/L (ref 10–40)
Albumin: 3.9 g/dL (ref 3.6–5.1)
Alkaline phosphatase (APISO): 39 U/L (ref 36–130)
BUN: 12 mg/dL (ref 7–25)
CO2: 28 mmol/L (ref 20–32)
Calcium: 9.4 mg/dL (ref 8.6–10.3)
Chloride: 102 mmol/L (ref 98–110)
Creat: 0.89 mg/dL (ref 0.60–1.35)
GFR, Est African American: 126 mL/min/{1.73_m2} (ref >=60)
GFR, Est Non African American: 108 mL/min/{1.73_m2} (ref >=60)
Globulin: 3.7 g/dL (calc) (ref 1.9–3.7)
Glucose, Bld: 93 mg/dL (ref 65–99)
Potassium: 4.2 mmol/L (ref 3.5–5.3)
Sodium: 138 mmol/L (ref 135–146)
Total Bilirubin: 0.2 mg/dL (ref 0.2–1.2)
Total Protein: 7.6 g/dL (ref 6.1–8.1)

## 2021-01-21 LAB — CBC WITH DIFFERENTIAL/PLATELET
Absolute Monocytes: 446 cells/uL (ref 200–950)
Basophils Absolute: 39 cells/uL (ref 0–200)
Basophils Relative: 0.8 %
Eosinophils Absolute: 230 cells/uL (ref 15–500)
Eosinophils Relative: 4.7 %
HCT: 43.1 % (ref 38.5–50.0)
Hemoglobin: 13.1 g/dL — ABNORMAL LOW (ref 13.2–17.1)
Lymphs Abs: 2558 cells/uL (ref 850–3900)
MCH: 23.9 pg — ABNORMAL LOW (ref 27.0–33.0)
MCHC: 30.4 g/dL — ABNORMAL LOW (ref 32.0–36.0)
MCV: 78.5 fL — ABNORMAL LOW (ref 80.0–100.0)
MPV: 9.8 fL (ref 7.5–12.5)
Monocytes Relative: 9.1 %
Neutro Abs: 1627 cells/uL (ref 1500–7800)
Neutrophils Relative %: 33.2 %
Platelets: 283 10*3/uL (ref 140–400)
RBC: 5.49 10*6/uL (ref 4.20–5.80)
RDW: 18.1 % — ABNORMAL HIGH (ref 11.0–15.0)
Total Lymphocyte: 52.2 %
WBC: 4.9 10*3/uL (ref 3.8–10.8)

## 2021-01-21 LAB — SEDIMENTATION RATE: Sed Rate: 6 mm/h (ref 0–15)

## 2021-01-21 NOTE — Progress Notes (Signed)
Lab test show no evidence of side effects from humira treatment so can continue current treatment.

## 2021-02-11 ENCOUNTER — Encounter: Payer: Self-pay | Admitting: *Deleted

## 2021-02-28 ENCOUNTER — Other Ambulatory Visit: Payer: Self-pay | Admitting: Internal Medicine

## 2021-02-28 DIAGNOSIS — K5000 Crohn's disease of small intestine without complications: Secondary | ICD-10-CM

## 2021-03-31 ENCOUNTER — Encounter: Payer: Self-pay | Admitting: Internal Medicine

## 2021-03-31 ENCOUNTER — Ambulatory Visit (INDEPENDENT_AMBULATORY_CARE_PROVIDER_SITE_OTHER): Payer: 59 | Admitting: Internal Medicine

## 2021-03-31 VITALS — BP 120/82 | HR 83 | Ht 72.0 in | Wt 286.0 lb

## 2021-03-31 DIAGNOSIS — K50111 Crohn's disease of large intestine with rectal bleeding: Secondary | ICD-10-CM

## 2021-03-31 DIAGNOSIS — M129 Arthropathy, unspecified: Secondary | ICD-10-CM | POA: Diagnosis not present

## 2021-03-31 DIAGNOSIS — M0760 Enteropathic arthropathies, unspecified site: Secondary | ICD-10-CM

## 2021-03-31 DIAGNOSIS — K639 Disease of intestine, unspecified: Secondary | ICD-10-CM | POA: Diagnosis not present

## 2021-03-31 DIAGNOSIS — K5010 Crohn's disease of large intestine without complications: Secondary | ICD-10-CM

## 2021-03-31 DIAGNOSIS — K529 Noninfective gastroenteritis and colitis, unspecified: Secondary | ICD-10-CM

## 2021-03-31 DIAGNOSIS — Z8619 Personal history of other infectious and parasitic diseases: Secondary | ICD-10-CM

## 2021-03-31 NOTE — Patient Instructions (Signed)
Go to the lab today to pick up your H Pylori stool antigen kit.   Your provider has requested that you go to the basement level for lab work on 04/08/21. Press "B" on the elevator. The lab is located at the first door on the left as you exit the elevator.  Continue Humira.  Continue iron.  If you are age 38 or older, your body mass index should be between 23-30. Your Body mass index is 38.79 kg/m. If this is out of the aforementioned range listed, please consider follow up with your Primary Care Provider.  If you are age 44 or younger, your body mass index should be between 19-25. Your Body mass index is 38.79 kg/m. If this is out of the aformentioned range listed, please consider follow up with your Primary Care Provider.   __________________________________________________________  The [facility] providers would like to encourage you to use [facility] to communicate with providers for non-urgent requests or questions.  Due to long hold times on the telephone, sending your provider a message by [facility] may be a faster and more efficient way to get a response.  Please allow 48 business hours for a response.  Please remember that this is for non-urgent requests.   Due to recent changes in healthcare laws, you may see the results of your imaging and laboratory studies on MyChart before your provider has had a chance to review them.  We understand that in some cases there may be results that are confusing or concerning to you. Not all laboratory results come back in the same time frame and the provider may be waiting for multiple results in order to interpret others.  Please give [place] 48 hours in order for your provider to thoroughly review all the results before contacting the office for clarification of your results.

## 2021-03-31 NOTE — Progress Notes (Signed)
Subjective:    Patient ID: Jonathan Drake, male    DOB: 1981-06-17, 40 y.o.   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  Last seen 12/31/2020.  He is here alone today.  He reports that he has continued Humira 40 mg every 14 days.  Last dose was 03/27/2021.  With the last 2 doses he has had a little bit of the medication leaked out despite administering it correctly.  He is somewhat concerned that he may not have received a full dose.  About 4 days prior to his last injection he felt a flare of his bowel and joint symptoms.  He noticed joint pain and swelling as well as looser stools with small amount of blood present with wiping.  He also felt tightness in the joints of his hands and lower legs.  No abdominal pain.  He has remained off of prednisone.  He does feel a fine rash which is present on his upper extremities and at times on his chest.  He does not associate this with Humira dosing.  He is treating this with petroleum jelly.  Does not itch.  Does not hurt.  He has continued oral iron.  Review of Systems As per HPI, otherwise negative  Current Medications, Allergies, Past Medical History, Past Surgical History, Family History and Social History were reviewed in [employer] record.    Objective:   Physical Exam BP 120/82   Pulse 83   Ht 6' (1.829 m)   Wt 286 lb (129.7 kg)   SpO2 98%   BMI 38.79 kg/m  Gen: awake, alert, NAD HEENT: anicteric,  CV: RRR, no mrg Pulm: CTA b/l Abd: soft, NT/ND, +BS throughout Rectal: External exam only, improvement in the breakdown of the intergluteal cleft superior to the anus, skin remains somewhat cracked open at this area but it is smaller and less deep. Ext: no c/c/e Neuro: nonfocal  CBC    Component Value Date/Time   WBC 4.9 01/20/2021 0823   RBC 5.49  01/20/2021 0823   HGB 13.1 (L) 01/20/2021 0823   HCT 43.1 01/20/2021 0823   PLT 283 01/20/2021 0823   MCV 78.5 (L) 01/20/2021 0823   MCH 23.9 (L) 01/20/2021 0823   MCHC 30.4 (L) 01/20/2021 0823   RDW 18.1 (H) 01/20/2021 0823   LYMPHSABS 2,558 01/20/2021 0823   MONOABS 1.0 12/31/2020 0907   EOSABS 230 01/20/2021 0823   BASOSABS 39 01/20/2021 0823   CMP     Component Value Date/Time   NA 138 01/20/2021 0823   NA 138 08/29/2020 1407   K 4.2 01/20/2021 0823   CL 102 01/20/2021 0823   CO2 28 01/20/2021 0823   GLUCOSE 93 01/20/2021 0823   BUN 12 01/20/2021 0823   BUN 7 08/29/2020 1407   CREATININE 0.89 01/20/2021 0823   CALCIUM 9.4 01/20/2021 0823   PROT 7.6 01/20/2021 0823   PROT 8.3 08/15/2020 1446   ALBUMIN 4.2 12/31/2020 0907   ALBUMIN 3.3 (L) 08/15/2020 1446   AST 14 01/20/2021 0823   ALT 13 01/20/2021 0823   ALKPHOS 35 (L) 12/31/2020 0907   BILITOT 0.2 01/20/2021 0823   BILITOT 0.4 08/15/2020 1446   GFRNONAA 108 01/20/2021 0823   GFRAA 126 01/20/2021 0823   Iron/TIBC/Ferritin/ %Sat    Component Value Date/Time   IRON 35 (  L) 12/31/2020 0907   IRON 15 (L) 08/15/2020 1446   FERRITIN 61.3 12/31/2020 0907   FERRITIN 777 (H) 08/15/2020 1446   IRONPCTSAT 10.9 (L) 12/31/2020 0907        Assessment & Plan:  40 year old male with chronic and perianal Crohn's disease (dx  March 2022), IBD arthropathy, 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    Crohn's colitis with perianal involvement (dx March 2022) --clinical improvement though with some relapse a few days before the last injection.  This raises the question of drug level, perhaps he needs more frequent dosing.  I also will rule out antibodies though this would be very quick for antibodies to have developed.  Fortunately he has remained off of prednisone.  Lab work looks good with normal liver enzymes.  Hemoglobin improving --Return on 04/08/2021 for adalimumab trough level and antibody  test --For now continue Humira 40 mg every 14 days --We discussed healthy diet as well as exercise.  I recommended 30 to 45 minutes of moderate exercise with walking 4 days/week. --We also discussed the importance of sleep for healing and restoration.  He has been working the night shift at [employer] and having fragmented sleep.  With the kids going back to school he will likely request dayshift so that he can sleep more effectively.  2.  IBD related IDA --he has been on oral iron which she is tolerating.  Hemoglobin is slowly improving and almost normal.  Iron studies were still a bit low in May.  Continue oral iron once daily for now  3.  IBD arthropathy --following with Dr. Benjamine Mola in rheumatology and continue Humira  4.  H. pylori gastritis --treated with Pylera.  When he returns on the 30th he will bring a H. pylori stool test to confirm eradication  3 to 55-monthfollow-up, sooner if needed  30 minutes total spent today including patient facing time, coordination of care, reviewing medical history/procedures/pertinent radiology studies, and documentation of the encounter.

## 2021-04-09 ENCOUNTER — Other Ambulatory Visit: Payer: 59

## 2021-04-09 DIAGNOSIS — M0760 Enteropathic arthropathies, unspecified site: Secondary | ICD-10-CM

## 2021-04-09 DIAGNOSIS — K50111 Crohn's disease of large intestine with rectal bleeding: Secondary | ICD-10-CM

## 2021-04-09 DIAGNOSIS — K5010 Crohn's disease of large intestine without complications: Secondary | ICD-10-CM

## 2021-04-09 DIAGNOSIS — K639 Disease of intestine, unspecified: Secondary | ICD-10-CM

## 2021-04-10 LAB — HELICOBACTER PYLORI  SPECIAL ANTIGEN
MICRO NUMBER:: 12316113
SPECIMEN QUALITY: ADEQUATE

## 2021-04-18 LAB — ADALIMUMAB+AB (SERIAL MONITOR)
Adalimumab Drug Level: <0.6 ug/mL
Anti-Adalimumab Antibody: 1192 ng/mL

## 2021-04-21 ENCOUNTER — Other Ambulatory Visit: Payer: Self-pay | Admitting: Internal Medicine

## 2021-04-21 DIAGNOSIS — K50111 Crohn's disease of large intestine with rectal bleeding: Secondary | ICD-10-CM | POA: Insufficient documentation

## 2021-04-21 DIAGNOSIS — K5010 Crohn's disease of large intestine without complications: Secondary | ICD-10-CM | POA: Insufficient documentation

## 2021-04-21 NOTE — Progress Notes (Signed)
Thanks for the update

## 2021-04-22 ENCOUNTER — Telehealth: Payer: Self-pay

## 2021-04-22 ENCOUNTER — Ambulatory Visit: Payer: 59 | Admitting: Internal Medicine

## 2021-04-22 ENCOUNTER — Encounter: Payer: Self-pay | Admitting: Internal Medicine

## 2021-04-22 ENCOUNTER — Telehealth: Payer: Self-pay | Admitting: Pharmacy Technician

## 2021-04-22 NOTE — Telephone Encounter (Addendum)
Auth Submission: PENDING Payer: [employer] Medication & CPT/J Code(s) submitted: Remicade (Infliximab) J1745 Route of submission (phone, fax, portal): PHONE: [phone] FAX: [phone] Auth type: Buy/Bill 8 REF# [id number]  Patient will need to pay $125 by 06/09/21 to be covered 100% otherwise all deductilbles and out of pocket have been met Ref# [id number]  Will update once we receive a response.

## 2021-04-22 NOTE — Telephone Encounter (Signed)
Received fax that patient's PA for Humira would be expiring soon. For future reference, insurance ID is [id number]. Leading zero is missing on card and will flag patient as no longer having coverage if not included.  Submitted a FAXED Prior Authorization request to [facility] for HUMIRA via CoverMyMeds. Will update once we receive a response.   Key: [id number]

## 2021-04-23 ENCOUNTER — Other Ambulatory Visit: Payer: 59

## 2021-04-23 ENCOUNTER — Other Ambulatory Visit (HOSPITAL_COMMUNITY): Payer: Self-pay

## 2021-04-23 DIAGNOSIS — K5010 Crohn's disease of large intestine without complications: Secondary | ICD-10-CM

## 2021-04-23 DIAGNOSIS — K50111 Crohn's disease of large intestine with rectal bleeding: Secondary | ICD-10-CM

## 2021-04-23 NOTE — Telephone Encounter (Signed)
Pharmacy team will renew Humira authorization to prevent interruption in therapy as he transitions therapy.  Dr. Hilarie Fredrickson with GI is planning to start patient on Remicade once auth is approved d/t Humira antibody levels  Knox Saliva, PharmD, MPH, BCPS Clinical Pharmacist (Rheumatology and Pulmonology)

## 2021-04-25 NOTE — Telephone Encounter (Signed)
Received notification from [facility] regarding a prior authorization for HUMIRA. Authorization has been APPROVED from 04/25/21 to 04/24/22.   Patient can continue to fill through [employer]: [phone]  Authorization # [id number]  Knox Saliva, PharmD, MPH, BCPS Clinical Pharmacist (Rheumatology and Pulmonology)

## 2021-04-28 NOTE — Telephone Encounter (Signed)
I see infliximab is approved Is he scheduled and if not he needs to be to start induction ASAP Thanks JMP

## 2021-04-28 NOTE — Telephone Encounter (Addendum)
Dr. Hilarie Fredrickson,  Auth Submission: APPROVED - REMICADE Payer: Nilda Calamity Medication & CPT/J Code(s) submitted: Remicade (Infliximab) J1745 Route of submission (phone, fax, portal): PHONE: [phone] FAX: [phone] Auth type: Buy/Bill DATE: 04/21/21 - 04/21/22 Units/visits requested: 60 UNITS Reference number: [id number]

## 2021-04-28 NOTE — Telephone Encounter (Signed)
Dr. Hilarie Fredrickson,  Yes, he is in our scheduling que.  I will have him scheduled asap. Thanks Maudie Mercury

## 2021-05-05 NOTE — Progress Notes (Signed)
Office Visit Note  Patient: Jonathan Drake             Date of Birth: 28-Oct-1980           MRN: [id number]             PCP: Farrel Gordon, DO Referring: Delice Bison, DO Visit Date: 05/06/2021   Subjective:  Other (Patient states that Dr. Hilarie Fredrickson would like to switch him from Humira to Remicade IV. )   History of Present Illness: Jonathan Drake is a 40 y.o. male here for follow up for IBD associated arthritis with crohn's disease. Since our last visit he discontinued humira due to worsening symptom control and labs demonstrated development of anti-ADA Abs with plan to switch to remicade. He has some increase in joint pain and stiffness wrist worst although does not have the large swelling and degree of pain as before.  Previous HPI 01/20/21 Jonathan Drake is a 40 y.o. male here for follow up of Crohn's disease associated inflammatory arthritis after starting Humira.  Since his last visit he tapered off all prednisone and is now on Humira for almost 2 months.  He feels symptoms are 80 to 90% improved still notices some soreness and stiffness in the right hand but complete resolution of the joint swelling.  He has not had any diarrhea stools or any blood in the stool since 1 month ago.  He has not noticed any issues with the medicine such as injection site reaction or rashes.   Review of Systems  Constitutional:  Negative for fatigue.  HENT:  Negative for mouth sores, mouth dryness and nose dryness.   Eyes:  Negative for pain, itching and dryness.  Respiratory:  Negative for shortness of breath and difficulty breathing.   Cardiovascular:  Negative for chest pain and palpitations.  Gastrointestinal:  Negative for blood in stool, constipation and diarrhea.  Endocrine: Negative for increased urination.  Genitourinary:  Negative for difficulty urinating.  Musculoskeletal:  Positive for morning stiffness. Negative for joint pain, joint pain, joint swelling, myalgias, muscle tenderness  and myalgias.  Skin:  Negative for color change, rash and redness.  Allergic/Immunologic: Negative for susceptible to infections.  Neurological:  Negative for dizziness, numbness, headaches, memory loss and weakness.  Hematological:  Negative for bruising/bleeding tendency.  Psychiatric/Behavioral:  Negative for confusion.    PMFS History:  Patient Active Problem List   Diagnosis Date Noted   Crohn's disease of perianal region without complication ([place]) 93/23/5573   Crohn's disease of colon with rectal bleeding ([place]) 04/21/2021   Arthritis associated with inflammatory bowel disease 10/22/2020   High risk medication use 10/22/2020   Sickle cell trait ([place]) 10/22/2020   Iron deficiency anemia 08/16/2020   Hypertension 08/16/2020    Past Medical History:  Diagnosis Date   Arthritis    pos RA marker   Colitis    Crohn's disease ([place])    Iron deficiency anemia    Sickle cell trait ([place])     Family History  Problem Relation Age of Onset   Diabetes Mother    Aneurysm Mother    CAD Father    Arrhythmia Brother        2 brothers with pacemakers    Heart attack Other        uncle died of a heart attack at age 38   Colon cancer Neg Hx    Esophageal cancer Neg Hx    Pancreatic cancer Neg Hx    Ulcerative  colitis Neg Hx    Past Surgical History:  Procedure Laterality Date   NO PAST SURGERIES     Social History   Social History Narrative   Not on file    There is no immunization history on file for this patient.   Objective: Vital Signs: BP 127/78 (BP Location: Right Arm, Patient Position: Sitting, Cuff Size: Large)   Pulse 81   Ht 6' (1.829 m)   Wt 284 lb (128.8 kg)   BMI 38.52 kg/m    Physical Exam Constitutional:      Appearance: He is obese.  Cardiovascular:     Rate and Rhythm: Normal rate and regular rhythm.  Pulmonary:     Effort: Pulmonary effort is normal.     Breath sounds: Normal breath sounds.  Musculoskeletal:     Right lower leg: No edema.      Left lower leg: No edema.  Skin:    General: Skin is warm and dry.     Findings: No rash.  Neurological:     Mental Status: He is alert.  Psychiatric:        Mood and Affect: Mood normal.     Musculoskeletal Exam:  Shoulders full ROM no tenderness or swelling Elbows full ROM no tenderness or swelling Wrists right side crepitus and slightly decreased ROM, left side normal Fingers full ROM no tenderness or swelling Knees full ROM no tenderness or swelling   Investigation: No additional findings.  Imaging: No results found.  Recent Labs: Lab Results  Component Value Date   WBC 4.9 01/20/2021   HGB 13.1 (L) 01/20/2021   PLT 283 01/20/2021   NA 138 01/20/2021   K 4.2 01/20/2021   CL 102 01/20/2021   CO2 28 01/20/2021   GLUCOSE 93 01/20/2021   BUN 12 01/20/2021   CREATININE 0.89 01/20/2021   BILITOT 0.2 01/20/2021   ALKPHOS 35 (L) 12/31/2020   AST 14 01/20/2021   ALT 13 01/20/2021   PROT 7.6 01/20/2021   ALBUMIN 4.2 12/31/2020   CALCIUM 9.4 01/20/2021   GFRAA 126 01/20/2021   QFTBGOLDPLUS NEGATIVE 11/04/2020    Speciality Comments: No specialty comments available.  Procedures:  No procedures performed Allergies: Patient has no known allergies.   Assessment / Plan:     Visit Diagnoses: Arthritis associated with inflammatory bowel disease  Increase in disease activity but mostly arthralgia currently. I agree with documented plan from GI for transition to remicade. He has pending TMPT in anticipation of low dose azathioprine for combination therapy based on the very rapid development of neutralizing antibodies with humira. We discussed the azathioprine in clinic as well. Will plan to f/u in 87mo again to recheck disease activity on new treatment.  High risk medication use  Baseline labs checked appropriate for continued TNF inhibitor treatment and change to remicade same monitoring.  Orders: No orders of the defined types were placed in this encounter.  No  orders of the defined types were placed in this encounter.    Follow-Up Instructions: Return in about 2 months (around 07/06/2021) for IBD Arthritis ADA->INF + AZA f/u 8wks.   CCollier Salina MD  Note - This record has been created using [employer]  Chart creation errors have been sought, but may not always  have been located. Such creation errors do not reflect on  the standard of medical care.

## 2021-05-06 ENCOUNTER — Encounter: Payer: Self-pay | Admitting: Internal Medicine

## 2021-05-06 ENCOUNTER — Ambulatory Visit: Payer: 59 | Admitting: Internal Medicine

## 2021-05-06 ENCOUNTER — Other Ambulatory Visit: Payer: Self-pay

## 2021-05-06 VITALS — BP 127/78 | HR 81 | Ht 72.0 in | Wt 284.0 lb

## 2021-05-06 DIAGNOSIS — Z79899 Other long term (current) drug therapy: Secondary | ICD-10-CM | POA: Diagnosis not present

## 2021-05-06 DIAGNOSIS — K639 Disease of intestine, unspecified: Secondary | ICD-10-CM | POA: Diagnosis not present

## 2021-05-06 DIAGNOSIS — M0760 Enteropathic arthropathies, unspecified site: Secondary | ICD-10-CM | POA: Diagnosis not present

## 2021-05-07 ENCOUNTER — Telehealth: Payer: Self-pay | Admitting: Internal Medicine

## 2021-05-07 LAB — THIOPURINE METHYLTRANSFERASE (TPMT), RBC: Thiopurine Methyltransferase, RBC: 15 nmol/hr/mL RBC

## 2021-05-07 NOTE — Telephone Encounter (Signed)
Inbound call from pt requesting a call back stating that he is taking Humira but he wants to know if he should continue his dosage of Humira and start Remicade on Tues or start Remicade tomorrow. Please advise. Thank you.

## 2021-05-08 ENCOUNTER — Ambulatory Visit: Payer: 59

## 2021-05-08 ENCOUNTER — Other Ambulatory Visit: Payer: Self-pay

## 2021-05-08 MED ORDER — AZATHIOPRINE 50 MG PO TABS: 50.0000 mg | ORAL_TABLET | Freq: Every day | ORAL | 11 refills | Status: DC

## 2021-05-08 NOTE — Telephone Encounter (Signed)
Remicade on Tues Can stop the Humira Thanks JMP

## 2021-05-08 NOTE — Telephone Encounter (Signed)
Spoke with pt and he is aware.

## 2021-05-08 NOTE — Telephone Encounter (Signed)
Pt is starting his Remicade infusions on Tuesday. Pt states he is due to take his Humira injection and wants to know if he should take the Humira or just do the Remicade on Tuesday. Please advise.

## 2021-05-13 ENCOUNTER — Ambulatory Visit (INDEPENDENT_AMBULATORY_CARE_PROVIDER_SITE_OTHER): Payer: 59

## 2021-05-13 ENCOUNTER — Other Ambulatory Visit: Payer: Self-pay

## 2021-05-13 ENCOUNTER — Encounter: Payer: Self-pay | Admitting: Pharmacist

## 2021-05-13 VITALS — BP 148/78 | HR 77 | Temp 98.2°F | Resp 18 | Ht 72.0 in | Wt 286.2 lb

## 2021-05-13 DIAGNOSIS — K639 Disease of intestine, unspecified: Secondary | ICD-10-CM

## 2021-05-13 DIAGNOSIS — K50111 Crohn's disease of large intestine with rectal bleeding: Secondary | ICD-10-CM

## 2021-05-13 DIAGNOSIS — K5010 Crohn's disease of large intestine without complications: Secondary | ICD-10-CM

## 2021-05-13 DIAGNOSIS — M0760 Enteropathic arthropathies, unspecified site: Secondary | ICD-10-CM

## 2021-05-13 MED ORDER — ALBUTEROL SULFATE HFA 108 (90 BASE) MCG/ACT IN AERS: 2.0000 | INHALATION_SPRAY | Freq: Once | RESPIRATORY_TRACT | Status: DC | PRN

## 2021-05-13 MED ORDER — ACETAMINOPHEN 325 MG PO TABS
650.0000 mg | ORAL_TABLET | Freq: Once | ORAL | Status: AC
  Administered 2021-05-13: 650 mg via ORAL
  Filled 2021-05-13: qty 2

## 2021-05-13 MED ORDER — HYDROCORTISONE NA SUCCINATE PF 100 MG IJ SOLR
100.0000 mg | Freq: Once | INTRAMUSCULAR | Status: AC
  Administered 2021-05-13: 100 mg via INTRAVENOUS
  Filled 2021-05-13: qty 2

## 2021-05-13 MED ORDER — FAMOTIDINE IN NACL 20-0.9 MG/50ML-% IV SOLN: 20.0000 mg | Freq: Once | INTRAVENOUS | Status: DC | PRN

## 2021-05-13 MED ORDER — DIPHENHYDRAMINE HCL 50 MG/ML IJ SOLN: 50.0000 mg | Freq: Once | INTRAMUSCULAR | Status: DC | PRN

## 2021-05-13 MED ORDER — EPINEPHRINE 0.3 MG/0.3ML IJ SOAJ: 0.3000 mg | Freq: Once | INTRAMUSCULAR | Status: DC | PRN

## 2021-05-13 MED ORDER — DIPHENHYDRAMINE HCL 25 MG PO CAPS
25.0000 mg | ORAL_CAPSULE | Freq: Once | ORAL | Status: AC
  Administered 2021-05-13: 25 mg via ORAL
  Filled 2021-05-13: qty 1

## 2021-05-13 MED ORDER — SODIUM CHLORIDE 0.9 % IV SOLN: Freq: Once | INTRAVENOUS | Status: DC | PRN

## 2021-05-13 MED ORDER — SODIUM CHLORIDE 0.9 % IV SOLN
5.0000 mg/kg | Freq: Once | INTRAVENOUS | Status: AC
  Administered 2021-05-13: 600 mg via INTRAVENOUS
  Filled 2021-05-13: qty 60

## 2021-05-13 MED ORDER — METHYLPREDNISOLONE SODIUM SUCC 125 MG IJ SOLR: 125.0000 mg | Freq: Once | INTRAMUSCULAR | Status: DC | PRN

## 2021-05-13 NOTE — Progress Notes (Signed)
Diagnosis: Crohn's Disease  Provider:  Marshell Garfinkel, MD  Procedure: Infusion  IV Type: Peripheral, IV Location: L Antecubital  Remicade (Infliximab), Dose: 600  Infusion Start Time: 0177  Infusion Stop Time: 9390  Post Infusion IV Care: Observation period completed and Peripheral IV Discontinued  Discharge: Condition: Good, Destination: Home . AVS provided to patient.   Performed by:  Charlie Pitter, RN

## 2021-05-30 ENCOUNTER — Other Ambulatory Visit: Payer: Self-pay

## 2021-05-30 ENCOUNTER — Telehealth: Payer: Self-pay | Admitting: Pharmacy Technician

## 2021-05-30 ENCOUNTER — Ambulatory Visit (INDEPENDENT_AMBULATORY_CARE_PROVIDER_SITE_OTHER): Payer: 59

## 2021-05-30 VITALS — BP 121/74 | HR 59 | Temp 98.1°F | Resp 18 | Ht 72.0 in | Wt 290.0 lb

## 2021-05-30 DIAGNOSIS — M0760 Enteropathic arthropathies, unspecified site: Secondary | ICD-10-CM | POA: Diagnosis not present

## 2021-05-30 DIAGNOSIS — K50111 Crohn's disease of large intestine with rectal bleeding: Secondary | ICD-10-CM

## 2021-05-30 DIAGNOSIS — K639 Disease of intestine, unspecified: Secondary | ICD-10-CM

## 2021-05-30 DIAGNOSIS — K5010 Crohn's disease of large intestine without complications: Secondary | ICD-10-CM | POA: Diagnosis not present

## 2021-05-30 MED ORDER — DIPHENHYDRAMINE HCL 25 MG PO CAPS
25.0000 mg | ORAL_CAPSULE | Freq: Once | ORAL | Status: AC
  Administered 2021-05-30: 25 mg via ORAL
  Filled 2021-05-30: qty 1

## 2021-05-30 MED ORDER — SODIUM CHLORIDE 0.9 % IV SOLN: Freq: Once | INTRAVENOUS | Status: DC | PRN

## 2021-05-30 MED ORDER — METHYLPREDNISOLONE SODIUM SUCC 125 MG IJ SOLR: 125.0000 mg | Freq: Once | INTRAMUSCULAR | Status: DC | PRN

## 2021-05-30 MED ORDER — SODIUM CHLORIDE 0.9 % IV SOLN
5.0000 mg/kg | Freq: Once | INTRAVENOUS | Status: AC
  Administered 2021-05-30: 700 mg via INTRAVENOUS
  Filled 2021-05-30: qty 70

## 2021-05-30 MED ORDER — EPINEPHRINE 0.3 MG/0.3ML IJ SOAJ: 0.3000 mg | Freq: Once | INTRAMUSCULAR | Status: DC | PRN

## 2021-05-30 MED ORDER — ACETAMINOPHEN 325 MG PO TABS
650.0000 mg | ORAL_TABLET | Freq: Once | ORAL | Status: AC
  Administered 2021-05-30: 650 mg via ORAL
  Filled 2021-05-30: qty 2

## 2021-05-30 MED ORDER — FAMOTIDINE IN NACL 20-0.9 MG/50ML-% IV SOLN: 20.0000 mg | Freq: Once | INTRAVENOUS | Status: DC | PRN

## 2021-05-30 MED ORDER — ALBUTEROL SULFATE HFA 108 (90 BASE) MCG/ACT IN AERS: 2.0000 | INHALATION_SPRAY | Freq: Once | RESPIRATORY_TRACT | Status: DC | PRN

## 2021-05-30 MED ORDER — DIPHENHYDRAMINE HCL 50 MG/ML IJ SOLN: 50.0000 mg | Freq: Once | INTRAMUSCULAR | Status: DC | PRN

## 2021-05-30 MED ORDER — HYDROCORTISONE NA SUCCINATE PF 100 MG IJ SOLR
100.0000 mg | Freq: Once | INTRAMUSCULAR | Status: AC
  Administered 2021-05-30: 100 mg via INTRAVENOUS
  Filled 2021-05-30: qty 2

## 2021-05-30 NOTE — Telephone Encounter (Signed)
Auth Submission: REMICADE -  APPROVED (increased dose) Payer: [employer] Medication & CPT/J Code(s) submitted: Remicade (Infliximab) J1745 Route of submission (phone, fax, portal):  Auth type: Buy/Bill Units/visits requested: 70 UNITS Reference number: [id number] Approval from: 04/21/21 to 08/09/21  Increased dose = 70 units  Fyi note: must submit AUTHORIZATION CHANGE REQUEST FORM whenever the dose change.

## 2021-05-30 NOTE — Progress Notes (Signed)
Diagnosis: Crohn's Disease  Provider:  Marshell Garfinkel, MD  Procedure: Infusion  IV Type: Peripheral, IV Location: L Antecubital  Remicade (Infliximab), Dose: 700  Infusion Start Time: 6943  Infusion Stop Time: 7005  Post Infusion IV Care: Observation period completed and Peripheral IV Discontinued  Discharge: Condition: Good, Destination: Home . AVS provided to patient.   Performed by:  Charlie Pitter, RN

## 2021-06-01 ENCOUNTER — Other Ambulatory Visit: Payer: Self-pay | Admitting: Internal Medicine

## 2021-06-25 NOTE — Progress Notes (Signed)
Diagnosis: Crohn's Disease  Provider:  Marshell Garfinkel, MD  Procedure: Infusion  IV Type: Peripheral, IV Location: L Antecubital  Remicade (Infliximab), Dose: 600  Infusion Start Time: 5379  Infusion Stop Time: 4327  Post Infusion IV Care: Observation period completed and Peripheral IV Discontinued  Discharge: Condition: Good, Destination: Home . AVS provided to patient.   Performed by:  Cleophus Molt RN

## 2021-06-27 ENCOUNTER — Encounter: Payer: Self-pay | Admitting: Internal Medicine

## 2021-06-27 ENCOUNTER — Ambulatory Visit: Payer: 59 | Admitting: Internal Medicine

## 2021-06-27 ENCOUNTER — Ambulatory Visit: Payer: 59

## 2021-06-27 ENCOUNTER — Ambulatory Visit (INDEPENDENT_AMBULATORY_CARE_PROVIDER_SITE_OTHER): Payer: 59 | Admitting: Internal Medicine

## 2021-06-27 VITALS — BP 119/70 | HR 66 | Ht 72.0 in | Wt 290.0 lb

## 2021-06-27 DIAGNOSIS — K5090 Crohn's disease, unspecified, without complications: Secondary | ICD-10-CM

## 2021-06-27 DIAGNOSIS — M0760 Enteropathic arthropathies, unspecified site: Secondary | ICD-10-CM | POA: Diagnosis not present

## 2021-06-27 DIAGNOSIS — D500 Iron deficiency anemia secondary to blood loss (chronic): Secondary | ICD-10-CM

## 2021-06-27 DIAGNOSIS — K50111 Crohn's disease of large intestine with rectal bleeding: Secondary | ICD-10-CM | POA: Diagnosis not present

## 2021-06-27 DIAGNOSIS — K5010 Crohn's disease of large intestine without complications: Secondary | ICD-10-CM | POA: Diagnosis not present

## 2021-06-27 NOTE — Patient Instructions (Addendum)
Continue Remicade and Azathioprine.  Please follow up with Dr Hilarie Fredrickson in 3 months.  Your provider has requested that you go to the basement level for lab work before your next remicade infusion, 10/17/21. Press "B" on the elevator. The lab is located at the first door on the left as you exit the elevator.  If you are age 38 or older, your body mass index should be between 23-30. Your Body mass index is 39.33 kg/m. If this is out of the aforementioned range listed, please consider follow up with your Primary Care Provider.  If you are age 46 or younger, your body mass index should be between 19-25. Your Body mass index is 39.33 kg/m. If this is out of the aformentioned range listed, please consider follow up with your Primary Care Provider.   ________________________________________________________  The [facility] providers would like to encourage you to use [facility] to communicate with providers for non-urgent requests or questions.  Due to long hold times on the telephone, sending your provider a message by [facility] may be a faster and more efficient way to get a response.  Please allow 48 business hours for a response.  Please remember that this is for non-urgent requests.  _______________________________________________________ Due to recent changes in healthcare laws, you may see the results of your imaging and laboratory studies on MyChart before your provider has had a chance to review them.  We understand that in some cases there may be results that are confusing or concerning to you. Not all laboratory results come back in the same time frame and the provider may be waiting for multiple results in order to interpret others.  Please give [place] 48 hours in order for your provider to thoroughly review all the results before contacting the office for clarification of your results.

## 2021-06-27 NOTE — Progress Notes (Signed)
   Subjective:    Patient ID: Jonathan Drake, male    DOB: [birth year], 40 y.o.   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  I last saw him in August 2022.  He is here alone today.  Since last being seen it was discovered that he had 0 Humira level with high antibody formation.  We have since changed him to infliximab and he has gotten 2 of the 3 infusion doses with the third dose scheduled for Monday, 06/30/2021.  He also started low-dose azathioprine 50 mg daily after TPMT level was normal.  He reports that he is feeling much much better.  His abdominal symptoms have resolved.  He is having actually mild constipation and so he stopped oral iron.  He is having 1 maybe 2 bowel movements per day.  No blood in stool.  No mucus in stool.  Joint symptoms have entirely resolved at this point.  He is back to work.  He has been off prednisone.  He has followed up with Dr. Benjamine Mola with rheumatology   Review of Systems As per HPI, otherwise negative  Current Medications, Allergies, Past Medical History, Past Surgical History, Family History and Social History were reviewed in [place] record.    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   Crohn's colitis with perianal involvement (dx March 2022) --clinically much improved since starting infliximab.  He had developed adalimumab antibodies completely neutralizing the drug at trough. --Continue infliximab, third and final induction dose due in 3 days; continue therapy every 8 weeks  thereafter --I would like to see him back in about 16 weeks with an infliximab trough level just prior to being seen in March --Continue azathioprine 50 mg daily  2.  IDA related to IBD -- I am going to leave him off oral iron as he has no further rectal bleeding and has developed mild constipation. --Repeat CBC and iron studies at follow-up  3.  IBD arthropathy --following with Dr. Benjamine Mola and having a very good response currently to infliximab and low-dose azathioprine  4.  History of H. pylori gastritis --treated with Pylera with confirmation of eradication by stool antigen after last visit  Follow-up in March 2022

## 2021-06-30 ENCOUNTER — Other Ambulatory Visit: Payer: Self-pay

## 2021-06-30 ENCOUNTER — Ambulatory Visit (INDEPENDENT_AMBULATORY_CARE_PROVIDER_SITE_OTHER): Payer: 59

## 2021-06-30 VITALS — BP 108/65 | HR 70 | Temp 98.1°F | Resp 16 | Ht 72.0 in | Wt 297.2 lb

## 2021-06-30 DIAGNOSIS — K5010 Crohn's disease of large intestine without complications: Secondary | ICD-10-CM

## 2021-06-30 DIAGNOSIS — K50111 Crohn's disease of large intestine with rectal bleeding: Secondary | ICD-10-CM | POA: Diagnosis not present

## 2021-06-30 DIAGNOSIS — K639 Disease of intestine, unspecified: Secondary | ICD-10-CM

## 2021-06-30 DIAGNOSIS — M0760 Enteropathic arthropathies, unspecified site: Secondary | ICD-10-CM | POA: Diagnosis not present

## 2021-06-30 MED ORDER — ALBUTEROL SULFATE HFA 108 (90 BASE) MCG/ACT IN AERS: 2.0000 | INHALATION_SPRAY | Freq: Once | RESPIRATORY_TRACT | Status: DC | PRN

## 2021-06-30 MED ORDER — EPINEPHRINE 0.3 MG/0.3ML IJ SOAJ: 0.3000 mg | Freq: Once | INTRAMUSCULAR | Status: DC | PRN

## 2021-06-30 MED ORDER — ACETAMINOPHEN 325 MG PO TABS
650.0000 mg | ORAL_TABLET | Freq: Once | ORAL | Status: AC
  Administered 2021-06-30: 650 mg via ORAL
  Filled 2021-06-30: qty 2

## 2021-06-30 MED ORDER — HYDROCORTISONE NA SUCCINATE PF 100 MG IJ SOLR
100.0000 mg | Freq: Once | INTRAMUSCULAR | Status: AC
  Administered 2021-06-30: 100 mg via INTRAVENOUS
  Filled 2021-06-30: qty 2

## 2021-06-30 MED ORDER — FAMOTIDINE IN NACL 20-0.9 MG/50ML-% IV SOLN: 20.0000 mg | Freq: Once | INTRAVENOUS | Status: DC | PRN

## 2021-06-30 MED ORDER — DIPHENHYDRAMINE HCL 25 MG PO CAPS
25.0000 mg | ORAL_CAPSULE | Freq: Once | ORAL | Status: AC
  Administered 2021-06-30: 25 mg via ORAL
  Filled 2021-06-30: qty 1

## 2021-06-30 MED ORDER — SODIUM CHLORIDE 0.9 % IV SOLN: Freq: Once | INTRAVENOUS | Status: DC | PRN

## 2021-06-30 MED ORDER — DIPHENHYDRAMINE HCL 50 MG/ML IJ SOLN: 50.0000 mg | Freq: Once | INTRAMUSCULAR | Status: DC | PRN

## 2021-06-30 MED ORDER — METHYLPREDNISOLONE SODIUM SUCC 125 MG IJ SOLR: 125.0000 mg | Freq: Once | INTRAMUSCULAR | Status: DC | PRN

## 2021-06-30 MED ORDER — SODIUM CHLORIDE 0.9 % IV SOLN
5.0000 mg/kg | Freq: Once | INTRAVENOUS | Status: AC
  Administered 2021-06-30: 700 mg via INTRAVENOUS
  Filled 2021-06-30: qty 70

## 2021-06-30 NOTE — Progress Notes (Signed)
Diagnosis: Crohn's Disease  Provider:  Marshell Garfinkel, MD  Procedure: Infusion  IV Type: Peripheral, IV Location: R Hand  Remicade (Infliximab), Dose: 700 mg  Infusion Start Time: 0569 06/30/2021  Infusion Stop Time: 1224 06/30/2021  Post Infusion IV Care: Peripheral IV Discontinued  Discharge: Condition: Good, Destination: Home . AVS provided to patient.   Performed by:  Arnoldo Morale, RN

## 2021-07-10 ENCOUNTER — Ambulatory Visit: Payer: 59 | Admitting: Internal Medicine

## 2021-07-17 NOTE — Progress Notes (Deleted)
Office Visit Note  Patient: Jonathan Drake             Date of Birth: 1981/01/27           MRN: [id number]             PCP: Farrel Gordon, DO Referring: Farrel Gordon, DO Visit Date: 07/18/2021   Subjective:  No chief complaint on file.   History of Present Illness: Jonathan Drake is a 40 y.o. 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 ***   Previous HPI 05/06/21 Jonathan Drake is a 40 y.o. male here for follow up for IBD associated arthritis with crohn's disease. Since our last visit he discontinued humira due to worsening symptom control and labs demonstrated development of anti-ADA Abs with plan to switch to remicade. He has some increase in joint pain and stiffness wrist worst although does not have the large swelling and degree of pain as before.   Previous HPI 01/20/21 Jonathan Drake is a 40 y.o. male here for follow up of Crohn's disease associated inflammatory arthritis after starting Humira.  Since his last visit he tapered off all prednisone and is now on Humira for almost 2 months.  He feels symptoms are 80 to 90% improved still notices some soreness and stiffness in the right hand but complete resolution of the joint swelling.  He has not had any diarrhea stools or any blood in the stool since 1 month ago.  He has not noticed any issues with the medicine such as injection site reaction or rashes.   No Rheumatology ROS completed.   PMFS History:  Patient Active Problem List   Diagnosis Date Noted   Crohn's disease of perianal region without complication ([place]) 94/70/9628   Crohn's disease of colon with rectal bleeding ([place]) 04/21/2021   Arthritis associated with inflammatory bowel disease 10/22/2020   High risk medication use 10/22/2020   Sickle cell trait ([place]) 10/22/2020   Iron deficiency anemia 08/16/2020   Hypertension 08/16/2020     Past Medical History:  Diagnosis Date   Arthritis    pos RA marker   Colitis    Crohn's disease ([place])    Iron deficiency anemia    Sickle cell trait ([place])     Family History  Problem Relation Age of Onset   Diabetes Mother    Aneurysm Mother    CAD Father    Arrhythmia Brother        2 brothers with pacemakers    Heart attack Other        uncle died of a heart attack at age 47   Colon cancer Neg Hx    Esophageal cancer Neg Hx    Pancreatic cancer Neg Hx    Ulcerative colitis Neg Hx    Past Surgical History:  Procedure Laterality Date   COLONOSCOPY     NO PAST SURGERIES     Social History   Social History Narrative   Not on file    There is no immunization history on file for this patient.   Objective: Vital Signs: There were no vitals taken for this visit.   Physical Exam   Musculoskeletal Exam: ***  CDAI Exam: CDAI Score: -- Patient Global: --; Provider Global: -- Swollen: --; Tender: -- Joint Exam 07/18/2021   No joint exam has been documented for this visit  There is currently no information documented on the homunculus. Go to the Rheumatology activity and complete the homunculus joint exam.  Investigation: No additional findings.  Imaging: No results found.  Recent Labs: Lab Results  Component Value Date   WBC 4.9 01/20/2021   HGB 13.1 (L) 01/20/2021   PLT 283 01/20/2021   NA 138 01/20/2021   K 4.2 01/20/2021   CL 102 01/20/2021   CO2 28 01/20/2021   GLUCOSE 93 01/20/2021   BUN 12 01/20/2021   CREATININE 0.89 01/20/2021   BILITOT 0.2 01/20/2021   ALKPHOS 35 (L) 12/31/2020   AST 14 01/20/2021   ALT 13 01/20/2021   PROT 7.6 01/20/2021   ALBUMIN 4.2 12/31/2020   CALCIUM 9.4 01/20/2021   GFRAA 126 01/20/2021   QFTBGOLDPLUS NEGATIVE 11/04/2020    Speciality Comments: No specialty comments available.  Procedures:  No procedures performed Allergies: Patient has no known allergies.   Assessment / Plan:     Visit Diagnoses: No  diagnosis found.  ***  Orders: No orders of the defined types were placed in this encounter.  No orders of the defined types were placed in this encounter.    Follow-Up Instructions: No follow-ups on file.   Collier Salina, MD  Note - This record has been created using [employer].  Chart creation errors have been sought, but may not always  have been located. Such creation errors do not reflect on  the standard of medical care.

## 2021-07-18 ENCOUNTER — Ambulatory Visit: Payer: 59 | Admitting: Internal Medicine

## 2021-08-06 ENCOUNTER — Other Ambulatory Visit: Payer: Self-pay | Admitting: Pharmacy Technician

## 2021-08-07 ENCOUNTER — Encounter: Payer: Self-pay | Admitting: Internal Medicine

## 2021-08-22 ENCOUNTER — Telehealth: Payer: Self-pay | Admitting: Pharmacy Technician

## 2021-08-22 NOTE — Telephone Encounter (Signed)
Left v/m in regards to new insurance card due to [employer] no longer being affiliated in [place].   Next appt: 08/29/21 Will need updated insurance and PA submitted prior to next appt.  May possibly have to re-schedule appt until insurance and PA is approved.  Will f/u with response.

## 2021-08-27 NOTE — Telephone Encounter (Addendum)
°  [employer] Card provided by patient via phone.  Friday HEALTH PLAN ID# [id number] BIN# [id number] GR# [id number] PCN# [id number] PHONE# [id number] Deductible: $800 - Co-insur. 30% OOP  $3000  New PA submitted due to change in insurance  Auth Submission: PENDING Payer: FRIDAY Medication & CPT/J Code(s) submitted: Remicade (Infliximab) J1745 Route of submission (phone, fax, portal): FAX: [phone] PHONE: [phone] Auth type: Buy/Bill Units/visits requested: Q8WKS - 5MG /KG  Will update once we receive a response.

## 2021-08-29 ENCOUNTER — Ambulatory Visit: Payer: 59

## 2021-09-01 NOTE — Telephone Encounter (Signed)
Auth Submission: APPROVED Payer: FR[employer]AY Medication & CPT/J Code(s) submitted: Remicade (Infliximab) J1745 Route of submission (phone, fax, portal): FAX [phone] PHONE: [phone] Auth type: Buy/Bill Units/visits requested: 5MG /KG [id number] Reference number: [id number] Approval from: 08/27/21 to 08/27/22.

## 2021-09-05 ENCOUNTER — Telehealth: Payer: Self-pay | Admitting: Pharmacy Technician

## 2021-09-05 NOTE — Telephone Encounter (Addendum)
REMICADE Co-pay Card:  ID# [id number] GROUP# [id number] BIN# [id number] $20,000/YR  Debit card id: [id number] Cvv: [id number] Exp: 06/26 Pin: [id number]  Auto re-enrollment every year. Fax claims and EOB: [phone]

## 2021-09-08 ENCOUNTER — Encounter: Payer: Self-pay | Admitting: Internal Medicine

## 2021-09-11 ENCOUNTER — Ambulatory Visit (INDEPENDENT_AMBULATORY_CARE_PROVIDER_SITE_OTHER): Payer: 59

## 2021-09-11 ENCOUNTER — Encounter: Payer: Self-pay | Admitting: Internal Medicine

## 2021-09-11 ENCOUNTER — Other Ambulatory Visit: Payer: Self-pay

## 2021-09-11 VITALS — BP 118/74 | HR 56 | Temp 98.4°F | Resp 18 | Ht 72.0 in | Wt 307.4 lb

## 2021-09-11 DIAGNOSIS — K50111 Crohn's disease of large intestine with rectal bleeding: Secondary | ICD-10-CM | POA: Diagnosis not present

## 2021-09-11 DIAGNOSIS — K5010 Crohn's disease of large intestine without complications: Secondary | ICD-10-CM

## 2021-09-11 MED ORDER — EPINEPHRINE 0.3 MG/0.3ML IJ SOAJ: 0.3000 mg | Freq: Once | INTRAMUSCULAR | Status: DC | PRN

## 2021-09-11 MED ORDER — ACETAMINOPHEN 325 MG PO TABS
650.0000 mg | ORAL_TABLET | Freq: Once | ORAL | Status: AC
  Administered 2021-09-11: 650 mg via ORAL
  Filled 2021-09-11: qty 2

## 2021-09-11 MED ORDER — METHYLPREDNISOLONE SODIUM SUCC 125 MG IJ SOLR: 125.0000 mg | Freq: Once | INTRAMUSCULAR | Status: DC | PRN

## 2021-09-11 MED ORDER — SODIUM CHLORIDE 0.9 % IV SOLN: Freq: Once | INTRAVENOUS | Status: DC | PRN

## 2021-09-11 MED ORDER — DIPHENHYDRAMINE HCL 25 MG PO CAPS
25.0000 mg | ORAL_CAPSULE | Freq: Once | ORAL | Status: AC
  Administered 2021-09-11: 25 mg via ORAL
  Filled 2021-09-11: qty 1

## 2021-09-11 MED ORDER — FAMOTIDINE IN NACL 20-0.9 MG/50ML-% IV SOLN: 20.0000 mg | Freq: Once | INTRAVENOUS | Status: DC | PRN

## 2021-09-11 MED ORDER — ALBUTEROL SULFATE HFA 108 (90 BASE) MCG/ACT IN AERS: 2.0000 | INHALATION_SPRAY | Freq: Once | RESPIRATORY_TRACT | Status: DC | PRN

## 2021-09-11 MED ORDER — HYDROCORTISONE NA SUCCINATE PF 100 MG IJ SOLR
100.0000 mg | Freq: Once | INTRAMUSCULAR | Status: AC
  Administered 2021-09-11: 100 mg via INTRAVENOUS
  Filled 2021-09-11: qty 2

## 2021-09-11 MED ORDER — SODIUM CHLORIDE 0.9 % IV SOLN
5.0000 mg/kg | Freq: Once | INTRAVENOUS | Status: AC
  Administered 2021-09-11: 700 mg via INTRAVENOUS
  Filled 2021-09-11: qty 70

## 2021-09-11 MED ORDER — DIPHENHYDRAMINE HCL 50 MG/ML IJ SOLN: 50.0000 mg | Freq: Once | INTRAMUSCULAR | Status: DC | PRN

## 2021-09-11 NOTE — Progress Notes (Signed)
Diagnosis: Asthma  Provider:  Chilton Greathouse, MD  Procedure: Infusion  IV Type: Peripheral, IV Location: L Antecubital  Remicade (Infliximab), Dose: 700 mg  Infusion Start Time: 10.59  09/11/2021  Infusion Stop Time: 13.31 09/11/2021  Post Infusion IV Care: Peripheral IV Discontinued  Discharge: Condition: Good, Destination: Home . AVS provided to patient.   Performed by:  Garnette Czech, RN

## 2021-10-07 ENCOUNTER — Telehealth: Payer: Self-pay | Admitting: Internal Medicine

## 2021-10-07 NOTE — Telephone Encounter (Signed)
Inbound call from patient states he went on a cruise for week long and left Azathioprine medication at home. Would like to know if it is okay to continue taking after a break?

## 2021-10-07 NOTE — Telephone Encounter (Signed)
I have spoken to patient to advise that he may restart his azathioprine without issue. He verbalizes understanding.

## 2021-10-28 ENCOUNTER — Ambulatory Visit: Payer: 59 | Admitting: Internal Medicine

## 2021-11-07 ENCOUNTER — Ambulatory Visit (INDEPENDENT_AMBULATORY_CARE_PROVIDER_SITE_OTHER): Payer: 59

## 2021-11-07 VITALS — BP 121/78 | HR 68 | Temp 98.5°F | Resp 16 | Ht 72.0 in | Wt 313.8 lb

## 2021-11-07 DIAGNOSIS — K5010 Crohn's disease of large intestine without complications: Secondary | ICD-10-CM

## 2021-11-07 DIAGNOSIS — K50111 Crohn's disease of large intestine with rectal bleeding: Secondary | ICD-10-CM | POA: Diagnosis not present

## 2021-11-07 MED ORDER — ACETAMINOPHEN 325 MG PO TABS
650.0000 mg | ORAL_TABLET | Freq: Once | ORAL | Status: AC
  Administered 2021-11-07: 650 mg via ORAL
  Filled 2021-11-07: qty 2

## 2021-11-07 MED ORDER — HYDROCORTISONE NA SUCCINATE PF 100 MG IJ SOLR
100.0000 mg | Freq: Once | INTRAMUSCULAR | Status: AC
  Administered 2021-11-07: 100 mg via INTRAVENOUS
  Filled 2021-11-07: qty 2

## 2021-11-07 MED ORDER — DIPHENHYDRAMINE HCL 25 MG PO CAPS
25.0000 mg | ORAL_CAPSULE | Freq: Once | ORAL | Status: AC
  Administered 2021-11-07: 25 mg via ORAL
  Filled 2021-11-07: qty 1

## 2021-11-07 MED ORDER — SODIUM CHLORIDE 0.9 % IV SOLN
5.0000 mg/kg | Freq: Once | INTRAVENOUS | Status: AC
  Administered 2021-11-07: 700 mg via INTRAVENOUS
  Filled 2021-11-07: qty 70

## 2021-11-07 NOTE — Progress Notes (Signed)
Diagnosis: Crohn's Disease ? ?Provider:  Marshell Garfinkel, MD ? ?Procedure: Infusion ? ?IV Type: Peripheral, IV Location: L Hand ? ?Remicade (Infliximab), Dose: 700 ? ?Infusion Start Time: 9471 ? ?Infusion Stop Time: 2527 ? ?Post Infusion IV Care: Peripheral IV Discontinued ? ?Discharge: Condition: Good, Destination: Home . AVS provided to patient.  ? ?Performed by:  Cleophus Molt, RN  ?  ?

## 2021-12-29 ENCOUNTER — Ambulatory Visit: Payer: 59 | Admitting: Internal Medicine

## 2021-12-31 ENCOUNTER — Other Ambulatory Visit: Payer: Self-pay | Admitting: Internal Medicine

## 2022-01-02 ENCOUNTER — Ambulatory Visit (INDEPENDENT_AMBULATORY_CARE_PROVIDER_SITE_OTHER): Payer: 59

## 2022-01-02 ENCOUNTER — Other Ambulatory Visit: Payer: Self-pay | Admitting: Pharmacy Technician

## 2022-01-02 VITALS — BP 123/81 | HR 54 | Temp 98.2°F | Resp 18 | Ht 72.0 in | Wt 316.0 lb

## 2022-01-02 DIAGNOSIS — K50111 Crohn's disease of large intestine with rectal bleeding: Secondary | ICD-10-CM | POA: Diagnosis not present

## 2022-01-02 DIAGNOSIS — K5010 Crohn's disease of large intestine without complications: Secondary | ICD-10-CM | POA: Diagnosis not present

## 2022-01-02 MED ORDER — SODIUM CHLORIDE 0.9 % IV SOLN
5.0000 mg/kg | Freq: Once | INTRAVENOUS | Status: AC
  Administered 2022-01-02: 700 mg via INTRAVENOUS
  Filled 2022-01-02: qty 70

## 2022-01-02 MED ORDER — ACETAMINOPHEN 325 MG PO TABS
650.0000 mg | ORAL_TABLET | Freq: Once | ORAL | Status: AC
  Administered 2022-01-02: 650 mg via ORAL
  Filled 2022-01-02: qty 2

## 2022-01-02 MED ORDER — HYDROCORTISONE NA SUCCINATE PF 100 MG IJ SOLR
100.0000 mg | Freq: Once | INTRAMUSCULAR | Status: AC
  Administered 2022-01-02: 100 mg via INTRAVENOUS
  Filled 2022-01-02: qty 2

## 2022-01-02 MED ORDER — DIPHENHYDRAMINE HCL 25 MG PO CAPS
25.0000 mg | ORAL_CAPSULE | Freq: Once | ORAL | Status: AC
  Administered 2022-01-02: 25 mg via ORAL
  Filled 2022-01-02: qty 1

## 2022-01-02 NOTE — Progress Notes (Signed)
Diagnosis: Crohn's Disease  Provider:  Marshell Garfinkel, MD  Procedure: Infusion  IV Type: Peripheral, IV Location: R Antecubital  Remicade (Infliximab), Dose: 700 mg  Infusion Start Time: 1252  Infusion Stop Time: 1150  Post Infusion IV Care: Peripheral IV Discontinued  Discharge: Condition: Good, Destination: Home . AVS provided to patient.   Performed by:  Adelina Mings, LPN

## 2022-01-14 ENCOUNTER — Telehealth: Payer: Self-pay | Admitting: Pharmacy Technician

## 2022-01-14 NOTE — Telephone Encounter (Signed)
Spoke with patient in regards to co-pay card information. Advised importance of needing card info to process claims.  Patient will call back with card info.

## 2022-01-14 NOTE — Telephone Encounter (Signed)
Left v/m with patient in regards to co-pay card information. Awaiting call-back. Id: Exp: Cvv:

## 2022-01-14 NOTE — Progress Notes (Signed)
01/16/2022 Jonathan Drake [id number] [birth year]  Referring provider: Farrel Gordon, DO Primary GI doctor: Dr. Hilarie Fredrickson  ASSESSMENT AND PLAN:   Crohn's disease of colon with rectal bleeding ([place]) -For Crohn's disease nutritional monitoring recommended, will get today -Patient is instructed to avoid anti-inflammatories -Patient reminded to remind up-to-date on influenza, pneumococcal and COVID-19 vaccinations, shingles- will think about- given information.  -Skin exam: Recommend annual skin exams if patient is on immunosuppression for evaluation of nonmelanoma skin cancers. -Eye exam as needed. Infliximab 5 mg/kg every 8 weeks and azathioprine 50 mg daily  Last dose of infliximab 01/02/2022 follow up 6 months.  -     CBC with Differential/Platelet; Future -     High sensitivity CRP; Future -     Sedimentation rate; Future -     CALPROTECTIN; Future -     Comprehensive metabolic panel; Future  Iron deficiency anemia due to chronic blood loss -     IBC + Ferritin; Future  Crohn's related arthritis ([place]) Resolved with remicaide Continue to monitor  Medication management for high risk medications  Screening for def -     Vitamin B12; Future -     Folate; Future -     Vitamin D 1,25 dihydroxy; Future  Screening-pulmonary TB -     QuantiFERON-TB Gold Plus; Future    History of Present Illness:  41 y.o. 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 Hilarie Fredrickson.   Current History The patient has 1 bowel movements per day which are well formed.  Denies AB pain/rectal pain.  States no joint pain, mouth ulcers, changes in vision.  States he has gained weight since he was sick, working on trying to diet.   IBD history:Diagnosed March 2022 after colonoscopy Surgical history: no surgery.  Current medications and last dose:  Infliximab 5 mg/kg every 8 weeks and  azathioprine 50 mg daily  Last dose of infliximab 01/02/2022 Prior medications: (antibody titers, reasons stopped) - started 04/14//2022 Humira - 04/09/2021 Humira level 0 and high antibody formation - 05/13/2021 infliximab started with low-dose azathioprine 50 mg after TPMT level normal.  Last colonoscopy: 10/18/2020 initial diagnosis Perianal ulcerations at intergluteal cleft, perianal Crohn's disease with mild anal stenosis, normal ileum, colonic involvement with patchy inflammation in cecum ascending colon and transverse colon mild to moderate.  Severe inflammation larger passions descending and sigmoid colon and distal rectal, anal canal. Other medical history: Patient follows with Dr. Benjamine Mola with rheumatology for arthropathy  Recent labs: 10/18/2020 CRP 2.9 (08/15/2020 CRP 132) 01/20/2021 SED RATE 6 01/20/2021 WBC 4.9 HGB 13.1 MCV 78.5 Platelets 283 12/31/2020 Iron 35 Ferritin 61.3  01/20/2021 AST 14 ALT 13 Alkphos 35 TBili 0.2 TB GOLD 11/04/2020 NEGATIVE or TB skin if indeterminate.  HepBsAG 10/18/2020 NON-REACTIVE  TPMT Activity: 15  IBD Health Care Maintenance: Annual Flu Vaccine - suggested Pneumococcal Vaccine if receiving immunosuppression: -  suggested TB testing if on anti-TNF, yearly - DUE Vitamin D screening - will get this visit COVID vaccine- suggested Shingrix - had chicken pox.   Current Medications:        Current Outpatient Medications (Other):    azaTHIOprine (IMURAN) 50 MG tablet, TAKE 1 TABLET BY MOUTH EVERY DAY   inFLIXimab (REMICADE IV), Inject into the vein. Every 8 weeks  Surgical History:  He  has a past surgical history that includes No past surgeries and Colonoscopy. Family History:  His family history includes Aneurysm in his mother; Arrhythmia in his brother; CAD in his father; Diabetes in his mother; Heart attack in an other family member. Social History:   reports that he has never smoked. He has never used smokeless tobacco. He reports that he  does not drink alcohol and does not use drugs.  Current Medications, Allergies, Past Medical History, Past Surgical History, Family History and Social History were reviewed in [employer] record.  Physical Exam: BP 128/80   Pulse (!) 59   Ht 6' (1.829 m)   Wt (!) 315 lb 9.6 oz (143.2 kg)   SpO2 99%   BMI 42.80 kg/m  General:   Pleasant, well developed male in no acute distress Heart : Regular rate and rhythm; no murmurs Pulm: Clear anteriorly; no wheezing Abdomen:  Soft, Obese AB, Active bowel sounds. No tenderness . Without guarding and Without rebound, No organomegaly appreciated. Rectal: declines Extremities:  without  edema. Neurologic:  Alert and  oriented x4;  No focal deficits.  Psych:  Cooperative. Normal mood and affect.   Vladimir Crofts, PA-C 01/16/22

## 2022-01-15 ENCOUNTER — Encounter: Payer: Self-pay | Admitting: Internal Medicine

## 2022-01-16 ENCOUNTER — Ambulatory Visit: Payer: 59 | Admitting: Physician Assistant

## 2022-01-16 ENCOUNTER — Other Ambulatory Visit (INDEPENDENT_AMBULATORY_CARE_PROVIDER_SITE_OTHER): Payer: 59

## 2022-01-16 ENCOUNTER — Encounter: Payer: Self-pay | Admitting: Physician Assistant

## 2022-01-16 VITALS — BP 128/80 | HR 59 | Ht 72.0 in | Wt 315.6 lb

## 2022-01-16 DIAGNOSIS — E559 Vitamin D deficiency, unspecified: Secondary | ICD-10-CM

## 2022-01-16 DIAGNOSIS — K5090 Crohn's disease, unspecified, without complications: Secondary | ICD-10-CM

## 2022-01-16 DIAGNOSIS — D500 Iron deficiency anemia secondary to blood loss (chronic): Secondary | ICD-10-CM

## 2022-01-16 DIAGNOSIS — M0760 Enteropathic arthropathies, unspecified site: Secondary | ICD-10-CM

## 2022-01-16 DIAGNOSIS — E538 Deficiency of other specified B group vitamins: Secondary | ICD-10-CM | POA: Diagnosis not present

## 2022-01-16 DIAGNOSIS — K50111 Crohn's disease of large intestine with rectal bleeding: Secondary | ICD-10-CM

## 2022-01-16 DIAGNOSIS — Z79899 Other long term (current) drug therapy: Secondary | ICD-10-CM | POA: Diagnosis not present

## 2022-01-16 DIAGNOSIS — Z111 Encounter for screening for respiratory tuberculosis: Secondary | ICD-10-CM

## 2022-01-16 LAB — COMPREHENSIVE METABOLIC PANEL
ALT: 15 U/L (ref 0–53)
AST: 13 U/L (ref 0–37)
Albumin: 4 g/dL (ref 3.5–5.2)
Alkaline Phosphatase: 32 U/L — ABNORMAL LOW (ref 39–117)
BUN: 10 mg/dL (ref 6–23)
CO2: 31 mEq/L (ref 19–32)
Calcium: 9.4 mg/dL (ref 8.4–10.5)
Chloride: 102 mEq/L (ref 96–112)
Creatinine, Ser: 0.97 mg/dL (ref 0.40–1.50)
GFR: 98.56 mL/min (ref >60.00)
Glucose, Bld: 97 mg/dL (ref 70–99)
Potassium: 4.1 mEq/L (ref 3.5–5.1)
Sodium: 138 mEq/L (ref 135–145)
Total Bilirubin: 0.4 mg/dL (ref 0.2–1.2)
Total Protein: 7.6 g/dL (ref 6.0–8.3)

## 2022-01-16 LAB — IBC + FERRITIN
Ferritin: 95.4 ng/mL (ref 22.0–322.0)
Iron: 37 ug/dL — ABNORMAL LOW (ref 42–165)
Saturation Ratios: 11.3 % — ABNORMAL LOW (ref 20.0–50.0)
TIBC: 327.6 ug/dL (ref 250.0–450.0)
Transferrin: 234 mg/dL (ref 212.0–360.0)

## 2022-01-16 LAB — CBC WITH DIFFERENTIAL/PLATELET
Basophils Absolute: 0 10*3/uL (ref 0.0–0.1)
Basophils Relative: 0.6 % (ref 0.0–3.0)
Eosinophils Absolute: 0.1 10*3/uL (ref 0.0–0.7)
Eosinophils Relative: 3.1 % (ref 0.0–5.0)
HCT: 41.8 % (ref 39.0–52.0)
Hemoglobin: 13.5 g/dL (ref 13.0–17.0)
Lymphocytes Relative: 50.3 % — ABNORMAL HIGH (ref 12.0–46.0)
Lymphs Abs: 2.1 10*3/uL (ref 0.7–4.0)
MCHC: 32.3 g/dL (ref 30.0–36.0)
MCV: 82.1 fl (ref 78.0–100.0)
Monocytes Absolute: 0.4 10*3/uL (ref 0.1–1.0)
Monocytes Relative: 8.7 % (ref 3.0–12.0)
Neutro Abs: 1.6 10*3/uL (ref 1.4–7.7)
Neutrophils Relative %: 37.3 % — ABNORMAL LOW (ref 43.0–77.0)
Platelets: 241 10*3/uL (ref 150.0–400.0)
RBC: 5.09 Mil/uL (ref 4.22–5.81)
RDW: 15.9 % — ABNORMAL HIGH (ref 11.5–15.5)
WBC: 4.2 10*3/uL (ref 4.0–10.5)

## 2022-01-16 LAB — VITAMIN B12: Vitamin B-12: 533 pg/mL (ref 211–911)

## 2022-01-16 LAB — FOLATE: Folate: 14.1 ng/mL (ref >5.9)

## 2022-01-16 LAB — HIGH SENSITIVITY CRP: CRP, High Sensitivity: 1.01 mg/L (ref 0.000–5.000)

## 2022-01-16 LAB — SEDIMENTATION RATE: Sed Rate: 19 mm/hr — ABNORMAL HIGH (ref 0–15)

## 2022-01-16 NOTE — Patient Instructions (Addendum)
Your provider has requested that you go to the basement level for lab work before leaving today. Press "B" on the elevator. The lab is located at the first door on the left as you exit the elevator.  Health Maintenance in Inflammatory Bowel Disease  Vaccines Inflammatory Bowel Disease (IBD) is often treated with immunomodulatory medications (6-mercaptopurine, azathioprine, methotrexate), biologic therapies (adalimumab, certulizomab, natalizumab, infliximab, vedolizumab) or chronic steroids (at least 52m daily for 3 months), which suppress the immune system. Patients receiving this type of therapy are at higher risk to develop infections. Many infections can be prevented by vaccinations. Patients with IBD should follow the immunization schedule for the general adult population with one exception - patients on immunosuppressive therapy should NOT receive live vaccines.   Discuss vaccinations with your physician, but in general, IBD patients SHOULD receive the following vaccines:  Suggest shingrix vaccine.   Human papilloma virus (HPV)  The HPV vaccine is recommended in females aged 9-26. It is a 3-dose vaccine.   The HPV vaccine is also now recommended in males aged 13-21. In addition, males that are immunocompromised may get the vaccine up until age 39 It is a 3-dose vaccine.   Influenza  All adults should get the influenza vaccine annually.  IBD patients on immunosuppressive therapy should NOT get the intranasal vaccine, as it is a live vaccine.  Pneumococcal  All adults 65 years or older should receive the Pneumococcal Conjugate Vaccine (PCV13), followed by the Pneumococcal Polysaccharide Vaccine (PPSV23) at least 1 year later.  In addition, patients over the age of 13on immunosuppressive therapy should receive the PCV13, followed by PPSV23 no sooner than 8 weeks later. A booster PPSV23 is also recommended 5 years later.  Finally, patients that smoke should get the PPSV23 if not otherwise  vaccinated.  Tetanus, diphtheria, pertussis (Tdap/Td)  All adults should receive a 1-time Tdap.  A Td booster should be administered every 10 years.   Bone Health Patients with IBD can have markedly lower bone mineral densities than patients without IBD. Low bone mineral density is associated with fractures. Therefore, screening for bone disease, such as osteoporosis and osteopenia, is important in certain populations.  The American Gastroenterology Association (AGA) and the [facility] ([facility] recommend dual-energy x-ray absorptiometry scanning (DEXA) in postmenopausal women, men over the age of 540 patients with prolonged corticosteroid use (greater than 3 consecutive months or recurrent courses), patients with a personal history of low trauma fracture and patients with hypogonadism.   Tobacco Cessation Smoking worsens Crohn's disease. In addition, smoking is associated with many known cardiac, pulmonary and oncologic risks.   If you need help with quitting smoking, please talk to your doctor and call 1-800-QUIT NOW.  Cancer Screening IBD patients on immunosuppressive therapy may be more susceptible to certain types of cancer. However, specific evidence is conflicting. Therefore, patients with IBD should undergo age- and sex-specific cancer screening. Please discuss this important issue with your primary care provider.  Colon Cancer  Patients with either Ulcerative Colitis (UC) or Crohn's disease involving the colon should have colon cancer screening 8-10 years after initial diagnosis. After that time, the frequency of surveillance colonocoscopy will be determined by your gastroenterologist, but will usually occur every 1-2 years.   Patients with IBD and Primary Sclerosing Cholangitis ([place] need a screening colonoscopy at the time of diagnosis of [place] annually thereafter.   References: Itzkowitz SH, Present DH. Consensus Conference: Colorectal Cancer Screening and  Surveillance in Inflammatory Bowel Disease. Inflamm Bowel  Dis 2005;11:314-321.   Moscandrew M, Mahadevan U, Kane S. General Health Maintenance in IBD. Inflamm Bowel Dis 2009;15 [id number].

## 2022-01-16 NOTE — Progress Notes (Signed)
Addendum: Reviewed and agree with assessment and management plan. Lavelle Akel M, MD  

## 2022-01-22 LAB — VITAMIN D 1,25 DIHYDROXY
Vitamin D 1, 25 (OH)2 Total: 57 pg/mL (ref 18–72)
Vitamin D2 1, 25 (OH)2: <8 pg/mL
Vitamin D3 1, 25 (OH)2: 57 pg/mL

## 2022-01-22 LAB — QUANTIFERON-TB GOLD PLUS
Mitogen-NIL: >10 IU/mL
NIL: 0.03 IU/mL
QuantiFERON-TB Gold Plus: NEGATIVE
TB1-NIL: 0.01 IU/mL
TB2-NIL: 0 IU/mL

## 2022-02-27 ENCOUNTER — Ambulatory Visit (INDEPENDENT_AMBULATORY_CARE_PROVIDER_SITE_OTHER): Payer: 59

## 2022-02-27 VITALS — BP 109/72 | HR 63 | Temp 97.8°F | Resp 18 | Ht 72.0 in | Wt 314.4 lb

## 2022-02-27 DIAGNOSIS — K5010 Crohn's disease of large intestine without complications: Secondary | ICD-10-CM | POA: Diagnosis not present

## 2022-02-27 DIAGNOSIS — K50111 Crohn's disease of large intestine with rectal bleeding: Secondary | ICD-10-CM

## 2022-02-27 MED ORDER — ACETAMINOPHEN 325 MG PO TABS
650.0000 mg | ORAL_TABLET | Freq: Once | ORAL | Status: AC
  Administered 2022-02-27: 650 mg via ORAL
  Filled 2022-02-27: qty 2

## 2022-02-27 MED ORDER — SODIUM CHLORIDE 0.9 % IV SOLN
5.0000 mg/kg | Freq: Once | INTRAVENOUS | Status: AC
  Administered 2022-02-27: 700 mg via INTRAVENOUS
  Filled 2022-02-27: qty 70

## 2022-02-27 MED ORDER — DIPHENHYDRAMINE HCL 25 MG PO CAPS
25.0000 mg | ORAL_CAPSULE | Freq: Once | ORAL | Status: AC
  Administered 2022-02-27: 25 mg via ORAL
  Filled 2022-02-27: qty 1

## 2022-02-27 MED ORDER — METHYLPREDNISOLONE SODIUM SUCC 40 MG IJ SOLR
40.0000 mg | Freq: Once | INTRAMUSCULAR | Status: AC
  Administered 2022-02-27: 40 mg via INTRAVENOUS
  Filled 2022-02-27: qty 1

## 2022-02-27 NOTE — Progress Notes (Signed)
Diagnosis: Crohn's Disease  Provider:  Marshell Garfinkel, MD  Procedure: Infusion  IV Type: Peripheral, IV Location: L Hand  Remicade (Infliximab), Dose: 748m  Infusion Start Time: 05110 Infusion Stop Time: 12111 Post Infusion IV Care: Peripheral IV Discontinued  Discharge: Condition: Good, Destination: Home . AVS provided to patient.   Performed by:  CKoren Shiver RN

## 2022-03-17 ENCOUNTER — Encounter: Payer: Self-pay | Admitting: Internal Medicine

## 2022-04-15 ENCOUNTER — Encounter: Payer: 59 | Admitting: Student

## 2022-04-23 ENCOUNTER — Encounter: Payer: Self-pay | Admitting: Internal Medicine

## 2022-04-24 ENCOUNTER — Ambulatory Visit: Payer: 59

## 2022-05-01 ENCOUNTER — Encounter: Payer: Self-pay | Admitting: Internal Medicine

## 2022-05-01 ENCOUNTER — Ambulatory Visit (INDEPENDENT_AMBULATORY_CARE_PROVIDER_SITE_OTHER): Payer: Commercial Managed Care - HMO

## 2022-05-01 VITALS — BP 111/75 | HR 61 | Temp 98.3°F | Resp 16 | Ht 72.0 in | Wt 319.8 lb

## 2022-05-01 DIAGNOSIS — K5010 Crohn's disease of large intestine without complications: Secondary | ICD-10-CM

## 2022-05-01 DIAGNOSIS — K50111 Crohn's disease of large intestine with rectal bleeding: Secondary | ICD-10-CM | POA: Diagnosis not present

## 2022-05-01 MED ORDER — ACETAMINOPHEN 325 MG PO TABS
650.0000 mg | ORAL_TABLET | Freq: Once | ORAL | Status: AC
  Administered 2022-05-01: 650 mg via ORAL
  Filled 2022-05-01: qty 2

## 2022-05-01 MED ORDER — SODIUM CHLORIDE 0.9 % IV SOLN
700.0000 mg | Freq: Once | INTRAVENOUS | Status: AC
  Administered 2022-05-01: 700 mg via INTRAVENOUS
  Filled 2022-05-01: qty 70

## 2022-05-01 MED ORDER — METHYLPREDNISOLONE SODIUM SUCC 40 MG IJ SOLR
40.0000 mg | Freq: Once | INTRAMUSCULAR | Status: AC
  Administered 2022-05-01: 40 mg via INTRAVENOUS
  Filled 2022-05-01: qty 1

## 2022-05-01 MED ORDER — DIPHENHYDRAMINE HCL 25 MG PO CAPS
25.0000 mg | ORAL_CAPSULE | Freq: Once | ORAL | Status: AC
  Administered 2022-05-01: 25 mg via ORAL
  Filled 2022-05-01: qty 1

## 2022-05-01 NOTE — Progress Notes (Signed)
Diagnosis: Crohn's Disease  Provider:  Marshell Garfinkel MD  Procedure: Infusion  IV Type: Peripheral, IV Location: R Antecubital  Remicade (Infliximab), Dose: 74m  Infusion Start Time: 1110  Infusion Stop Time: 18301 Post Infusion IV Care: Peripheral IV Discontinued  Discharge: Condition: Good, Destination: Home . AVS provided to patient.   Performed by:  CKoren Shiver RN

## 2022-06-08 ENCOUNTER — Encounter: Payer: Self-pay | Admitting: Internal Medicine

## 2022-06-09 ENCOUNTER — Encounter: Payer: Self-pay | Admitting: Internal Medicine

## 2022-06-10 ENCOUNTER — Telehealth: Payer: Self-pay | Admitting: Pharmacy Technician

## 2022-06-10 NOTE — Telephone Encounter (Addendum)
PA RENEWAL: New insurance  Auth Submission: PENDING Payer: [employer]a Medication & CPT/J Code(s) submitted: Remicade (Infliximab) J1745 Route of submission (phone, fax, portal):  Phone # [phone] Fax # Auth type: Buy/Bill Units/visits requested:  Reference number: [id number] Approval from:  to  at [place]

## 2022-06-18 IMAGING — DX DG HAND COMPLETE 3+V*R*
4 series · 4 of 4 positions shown · non-contrast
Comparison: None.

CLINICAL DATA: 39-year-old male with swelling and pain

EXAM:
RIGHT HAND - COMPLETE 3+ VIEW

[hand pa (1 of 2)]
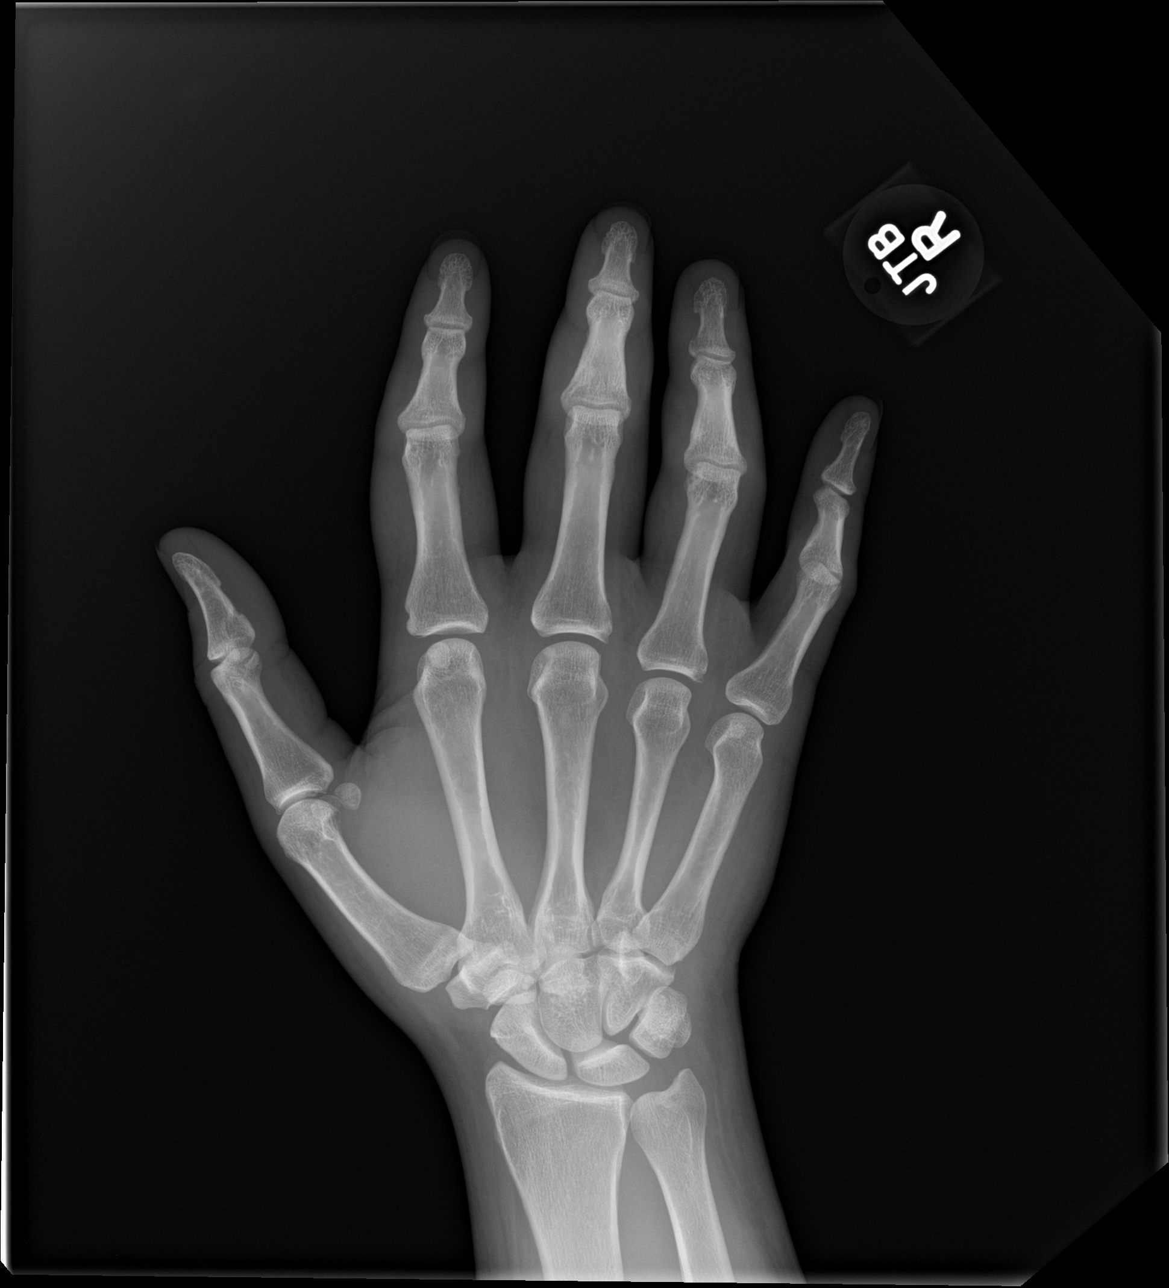

[hand obl]
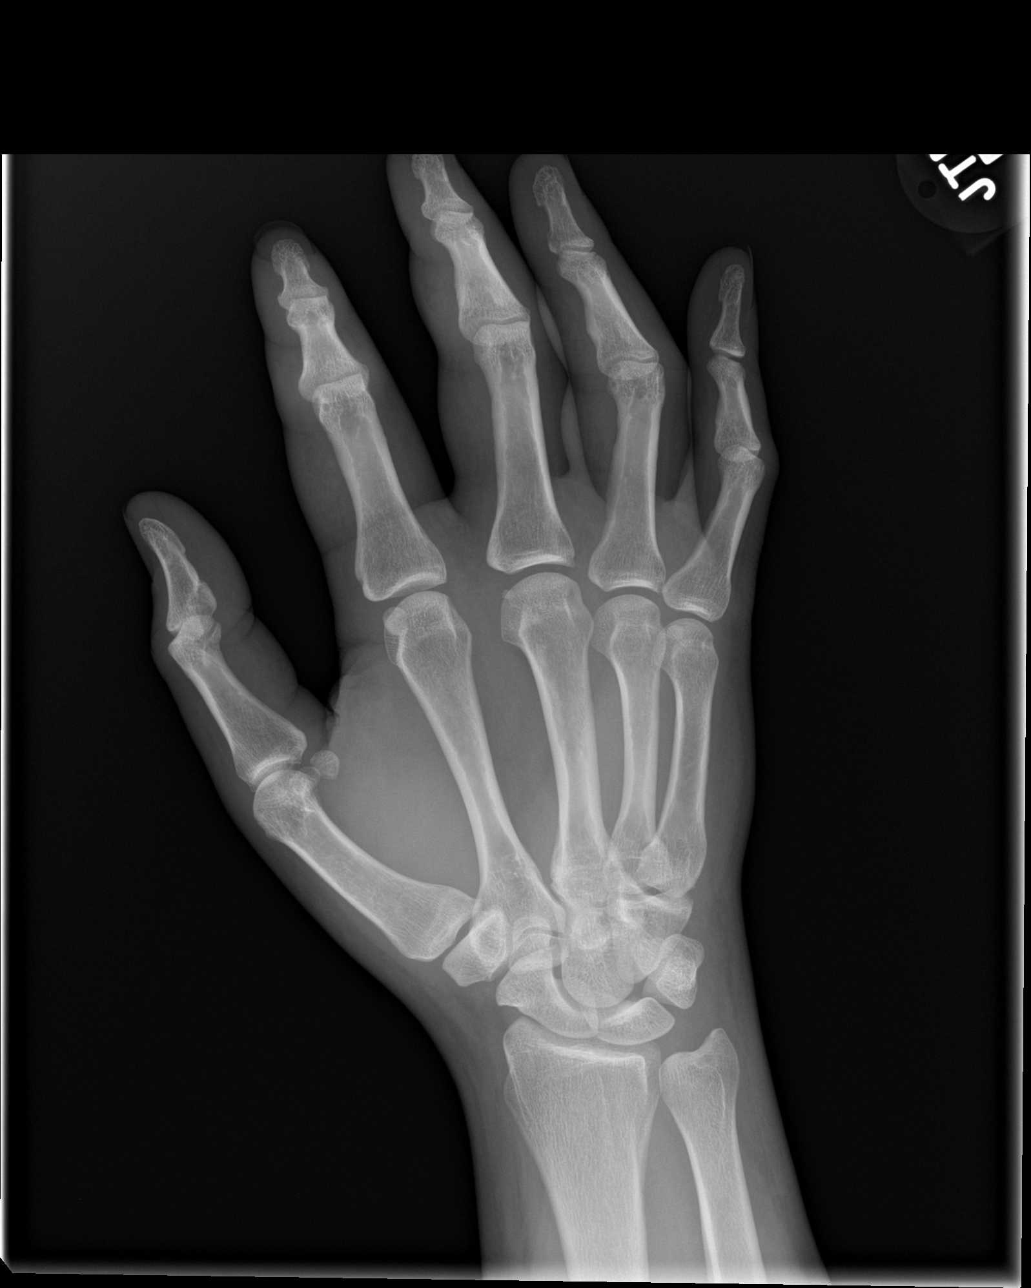

[hand lat]
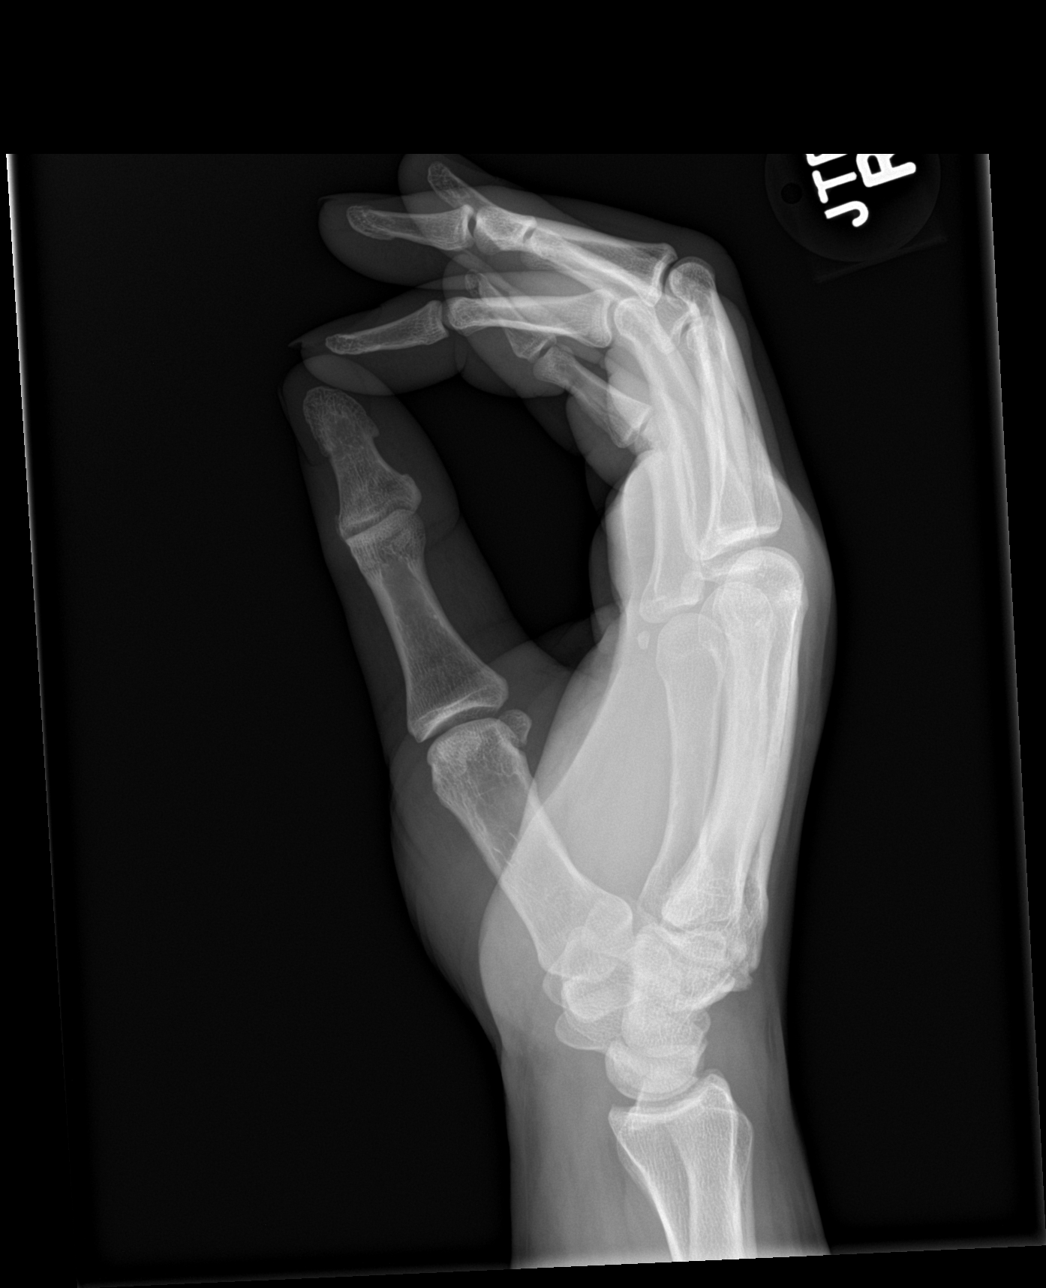

[hand pa (2 of 2)]
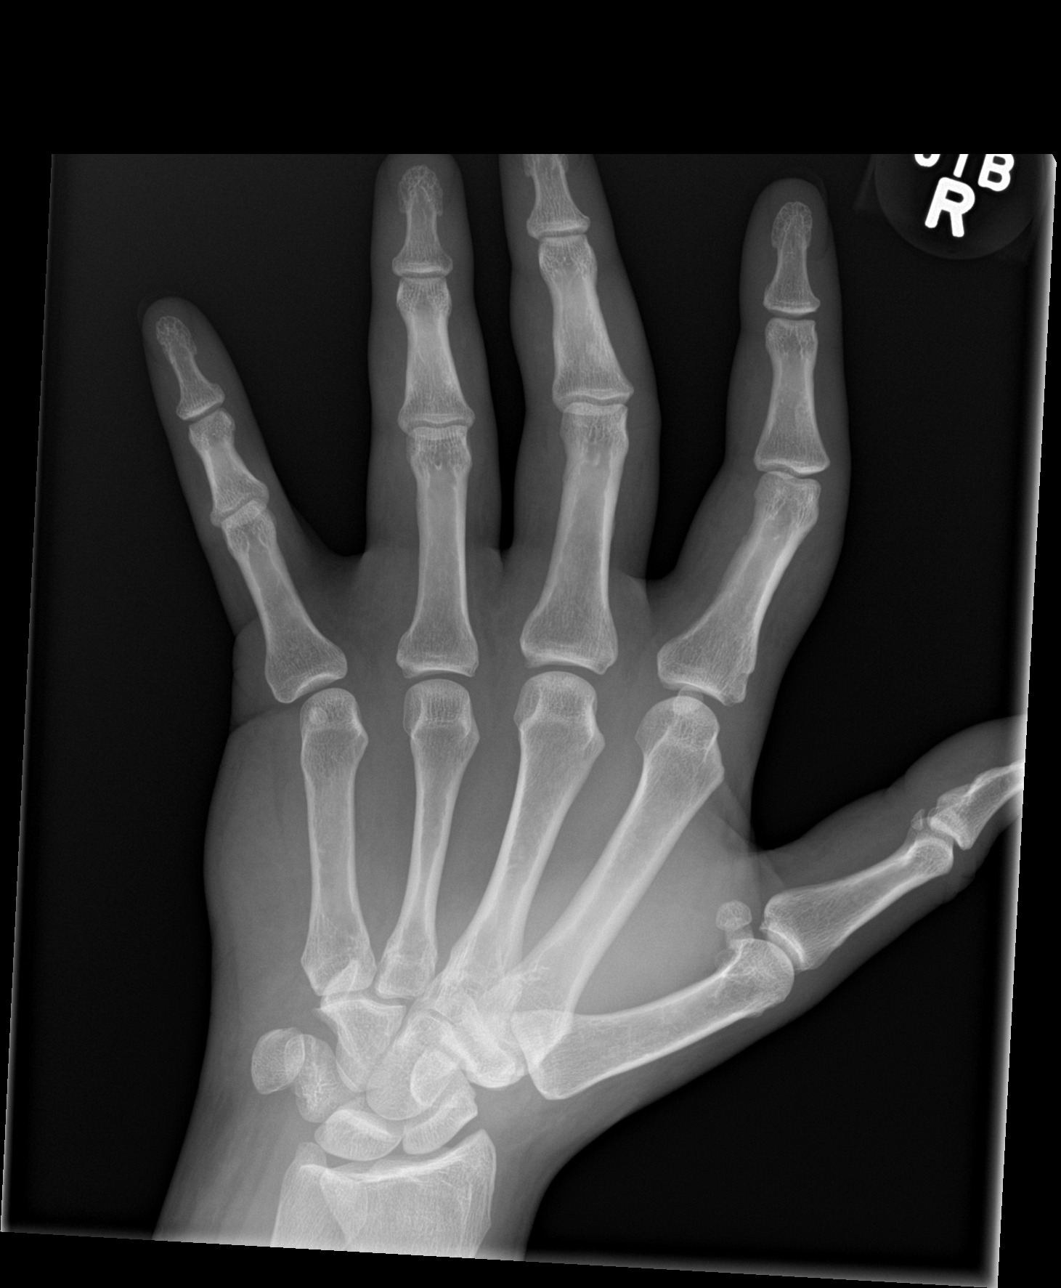

[4 of 4 positions shown; findings below may reference images not displayed]

FINDINGS: No acute displaced fracture. No subluxation/dislocation. No
radiopaque foreign body. No periarticular osteopenia. No erosive
changes. Questionable circumferential soft tissue swelling of the
first finger second finger third finger of the right hand. No
radiopaque foreign body.
IMPRESSION: Negative for acute bony abnormality.

Nonspecific soft tissue swelling of digits 2-4.

## 2022-06-18 IMAGING — DX DG ANKLE COMPLETE 3+V*L*
3 series · 3 of 3 positions shown · non-contrast
Comparison: None.

CLINICAL DATA: 39-year-old male with swelling and pain

EXAM:
LEFT ANKLE COMPLETE - 3+ VIEW

[ankle ap]
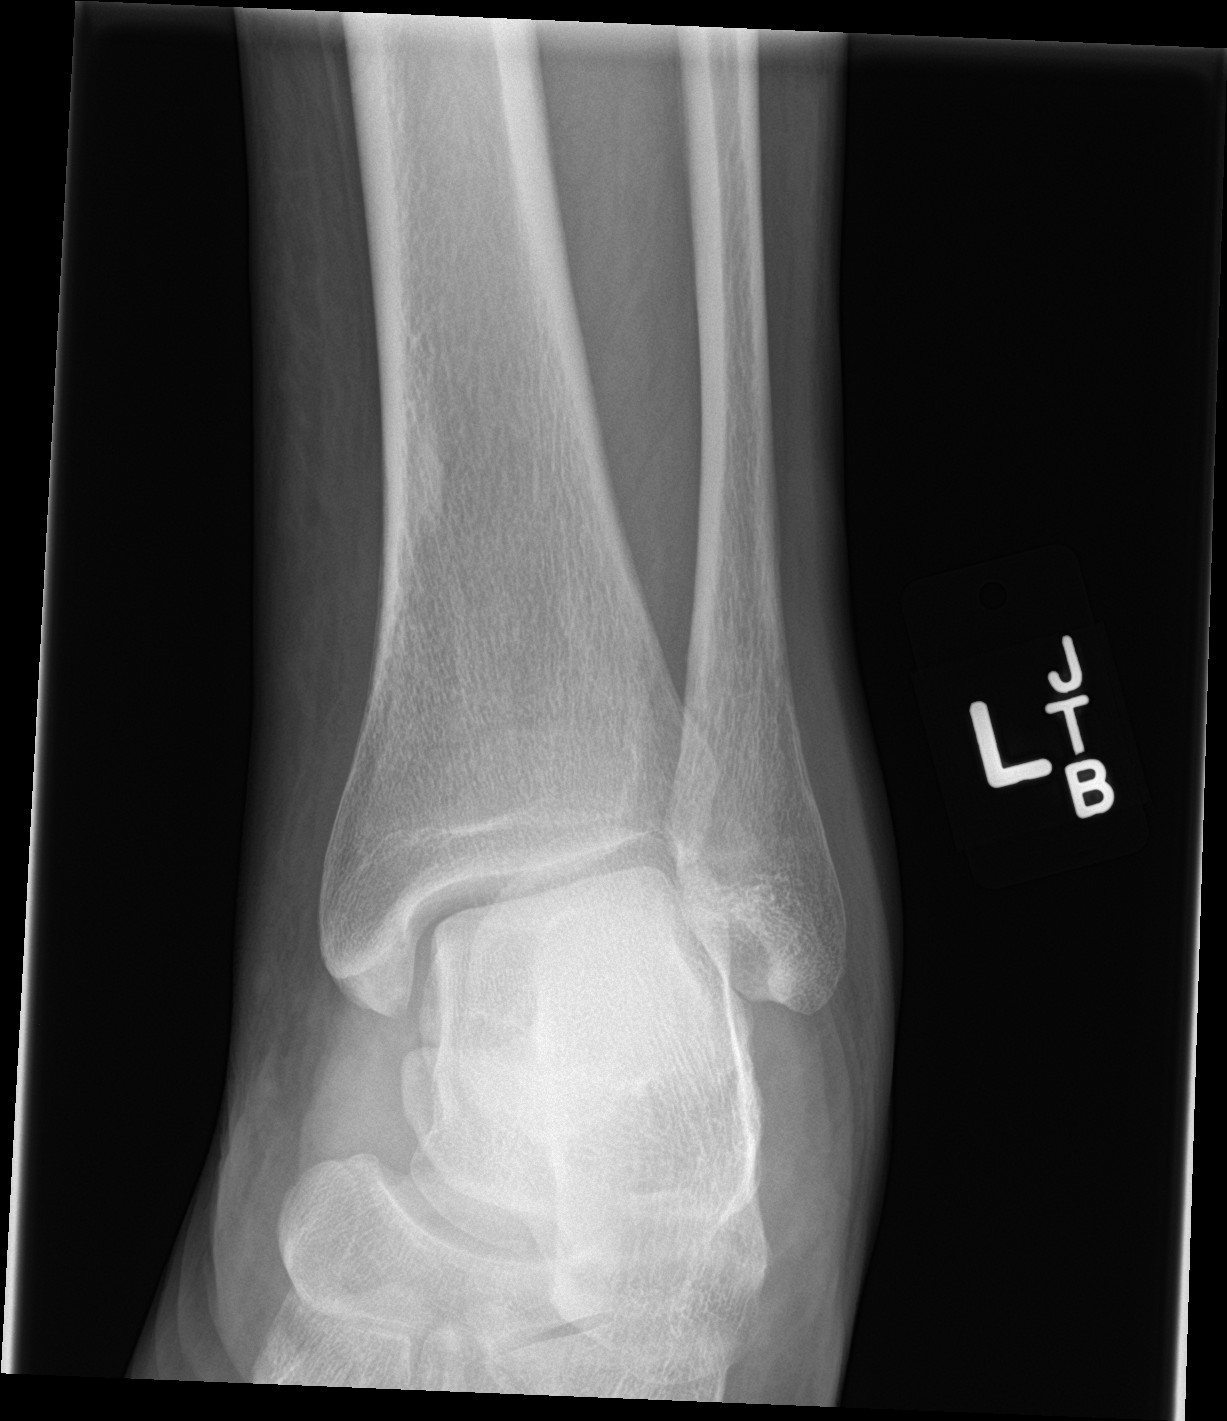

[ankle obl]
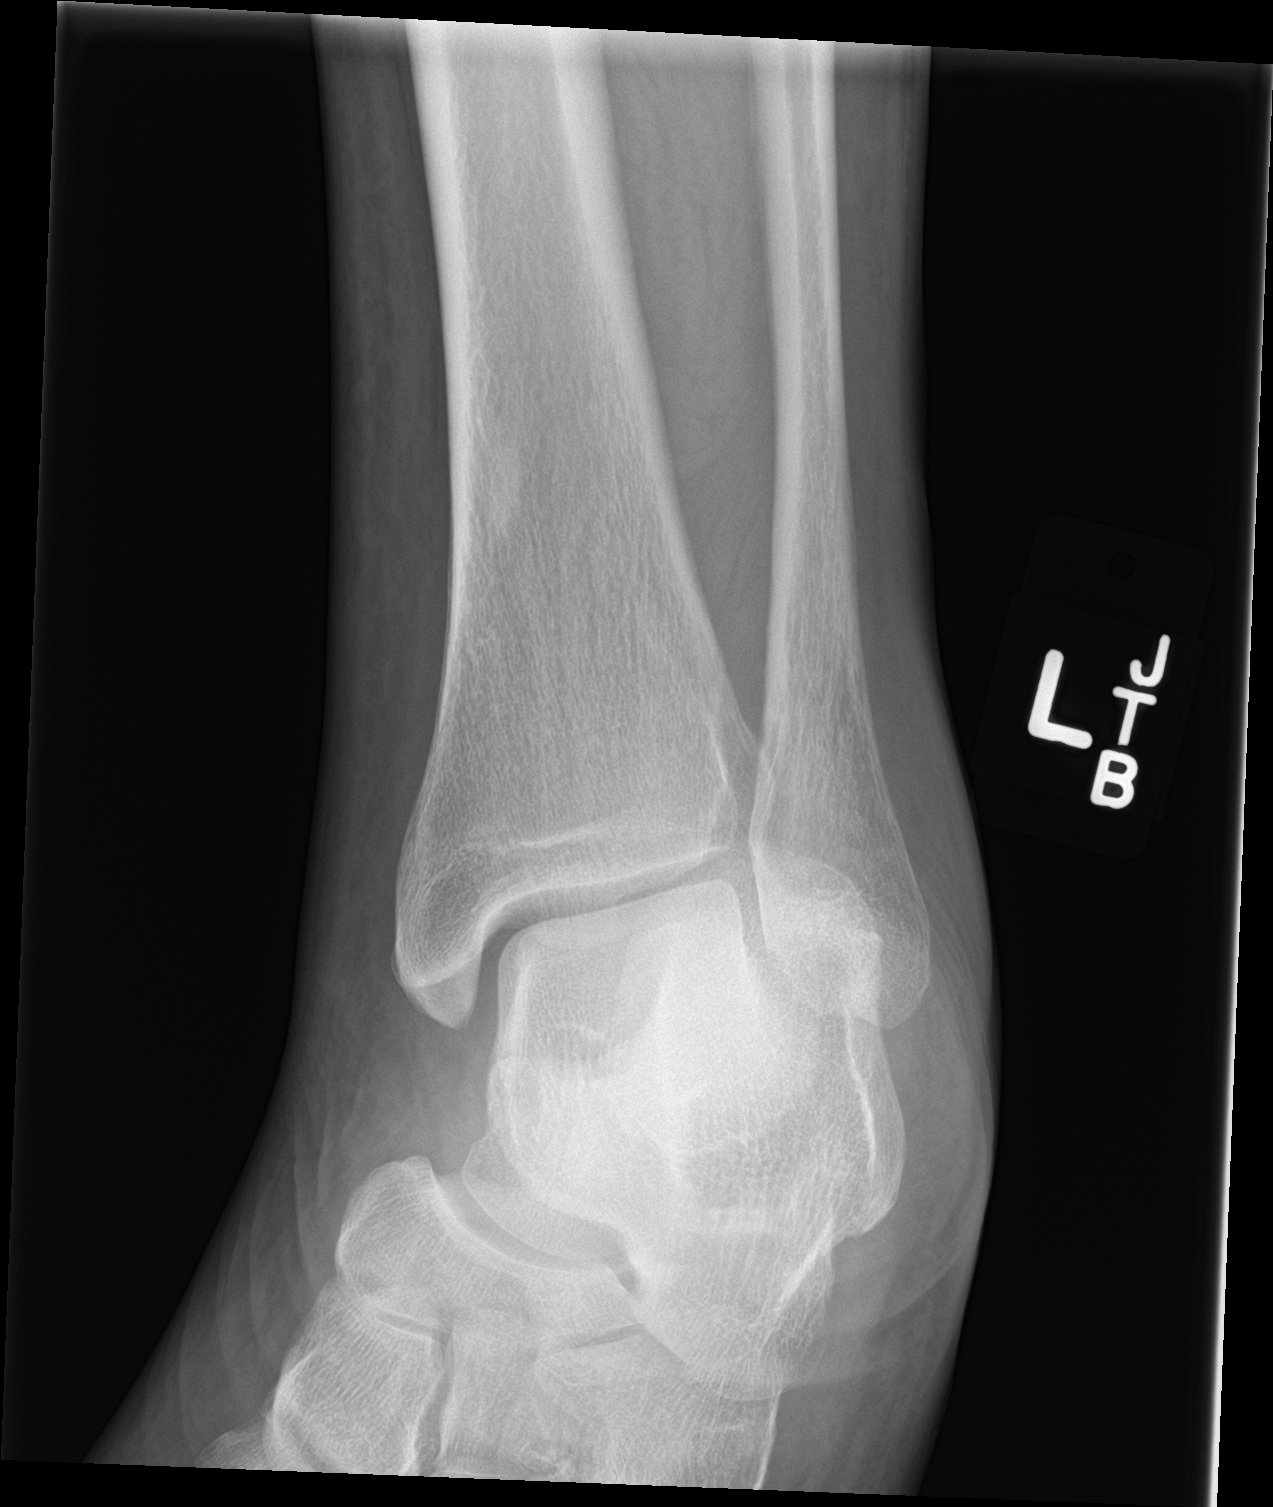

[ankle lat]
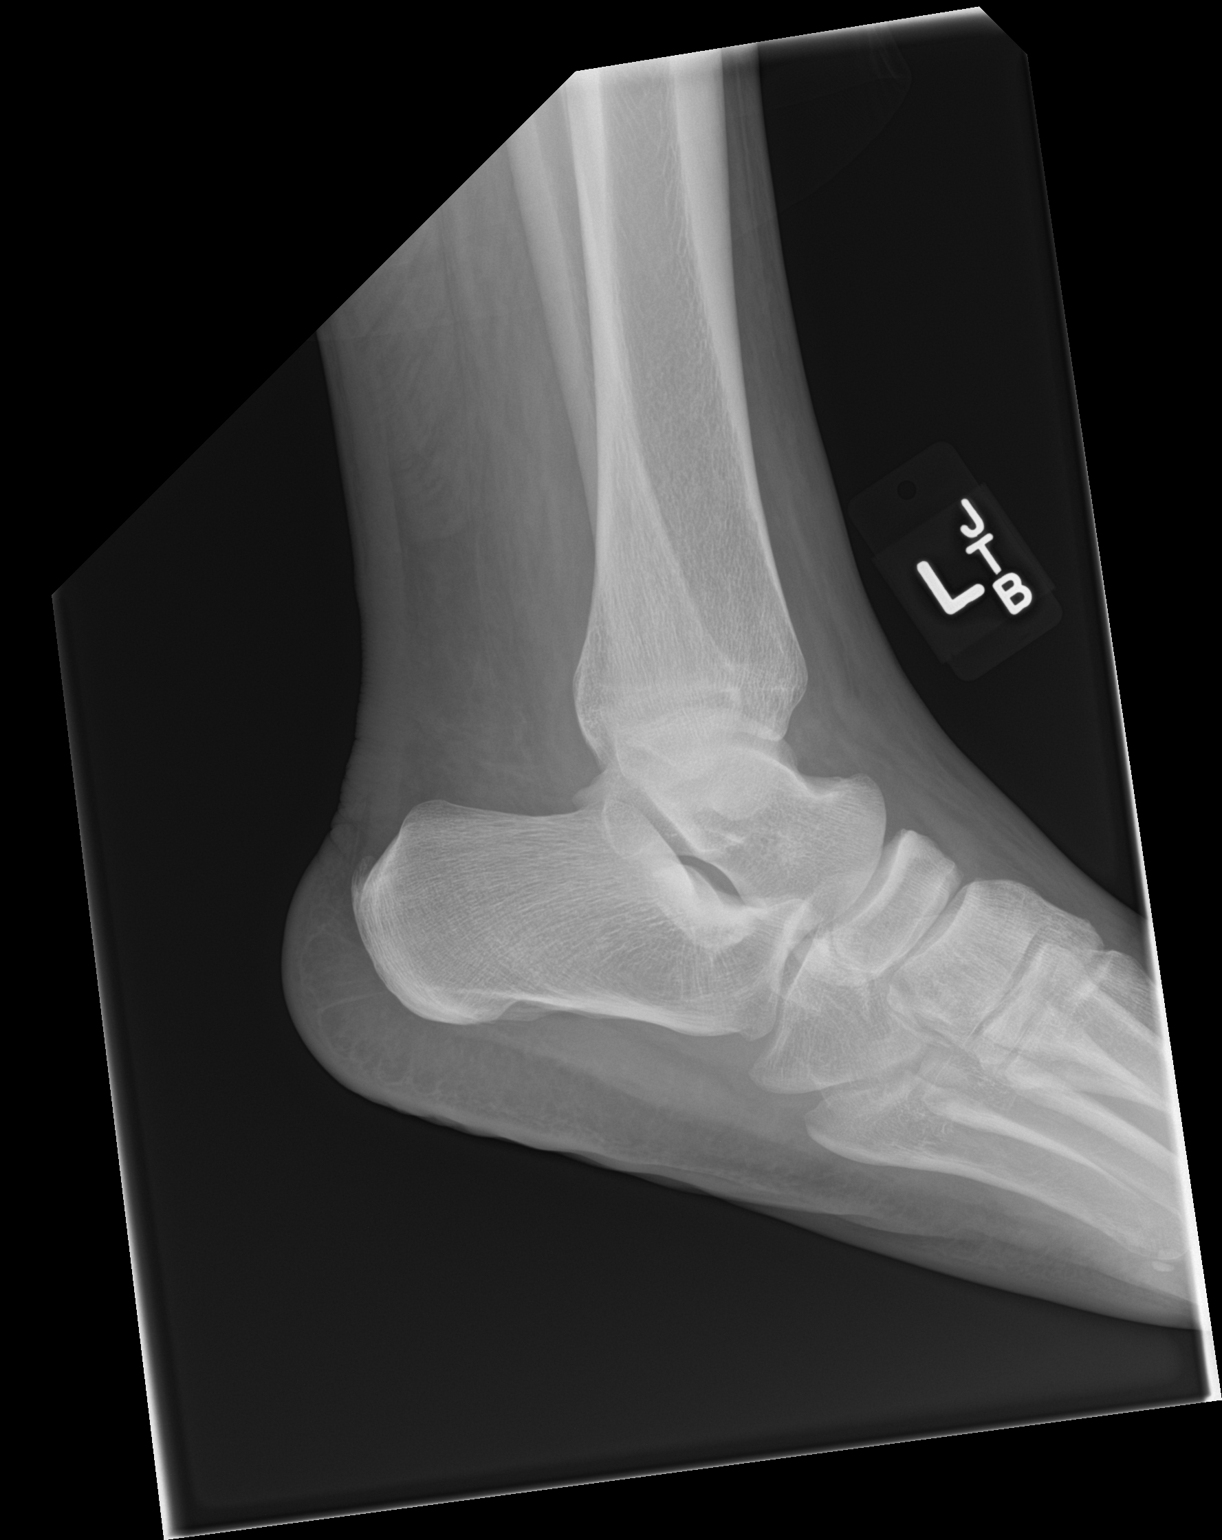

[3 of 3 positions shown; findings below may reference images not displayed]

FINDINGS: No acute displaced fracture. Ankle mortise is congruent. No
significant soft tissue swelling. Minimal degenerative changes of
the hindfoot
IMPRESSION: Negative for acute bony abnormality

## 2022-06-18 IMAGING — DX DG ANKLE COMPLETE 3+V*R*
3 series · 3 of 3 positions shown · non-contrast
Comparison: None.

CLINICAL DATA: 39-year-old male with joint pain and swelling

EXAM:
RIGHT ANKLE - COMPLETE 3+ VIEW

[ankle ap]
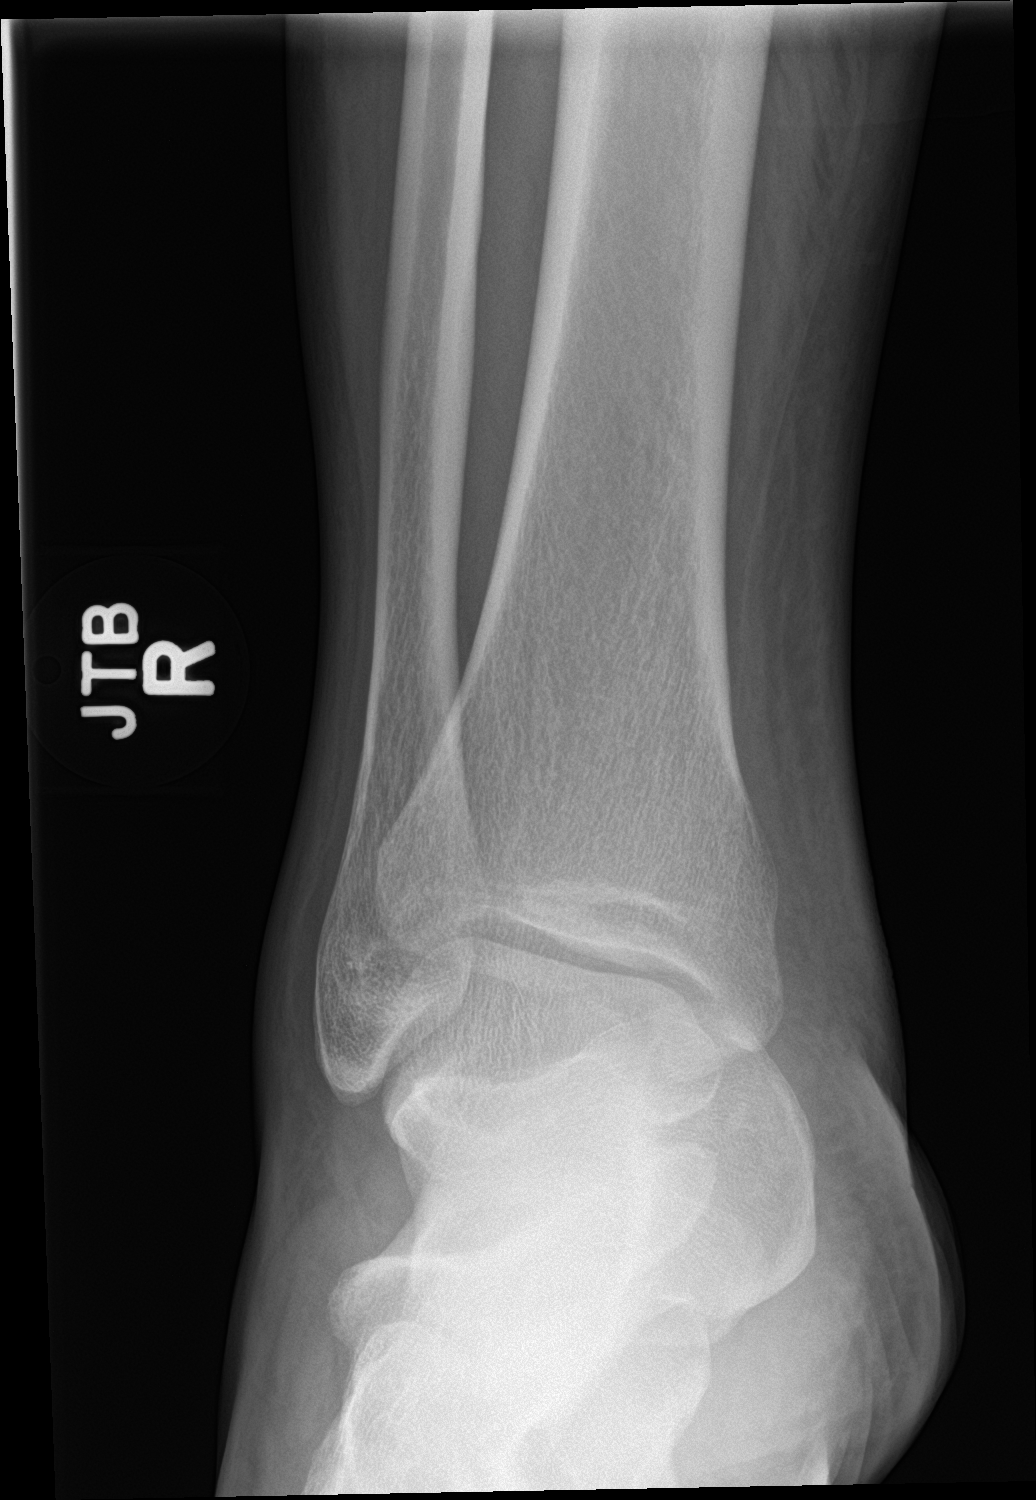

[ankle obl]
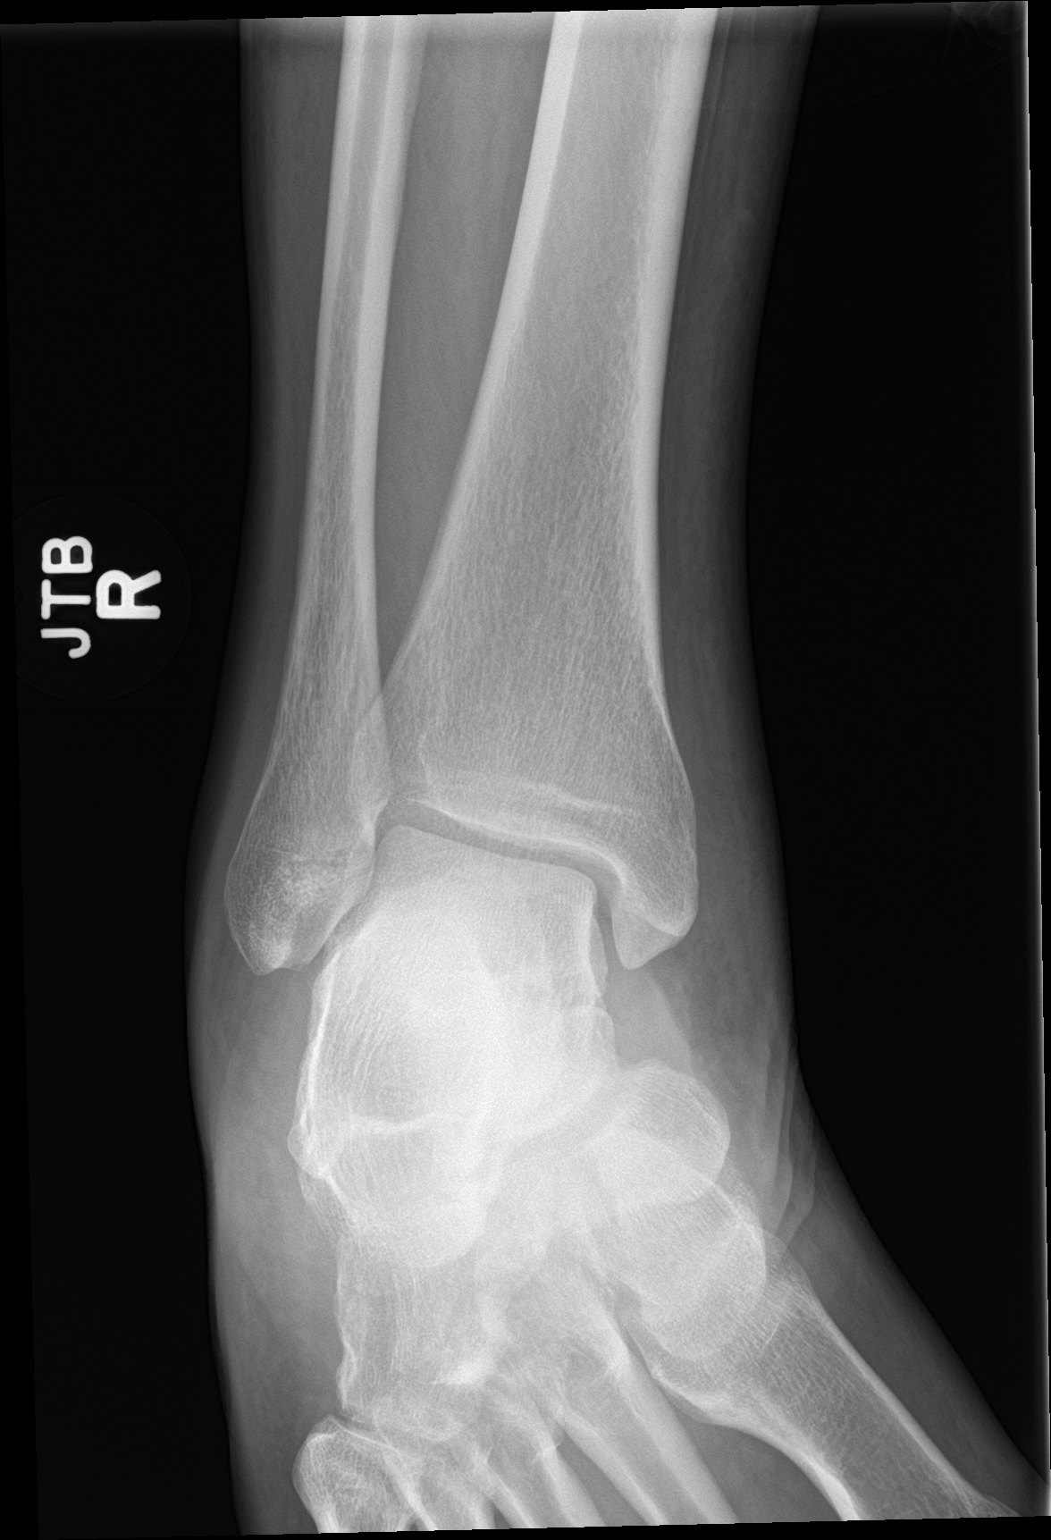

[ankle lat]
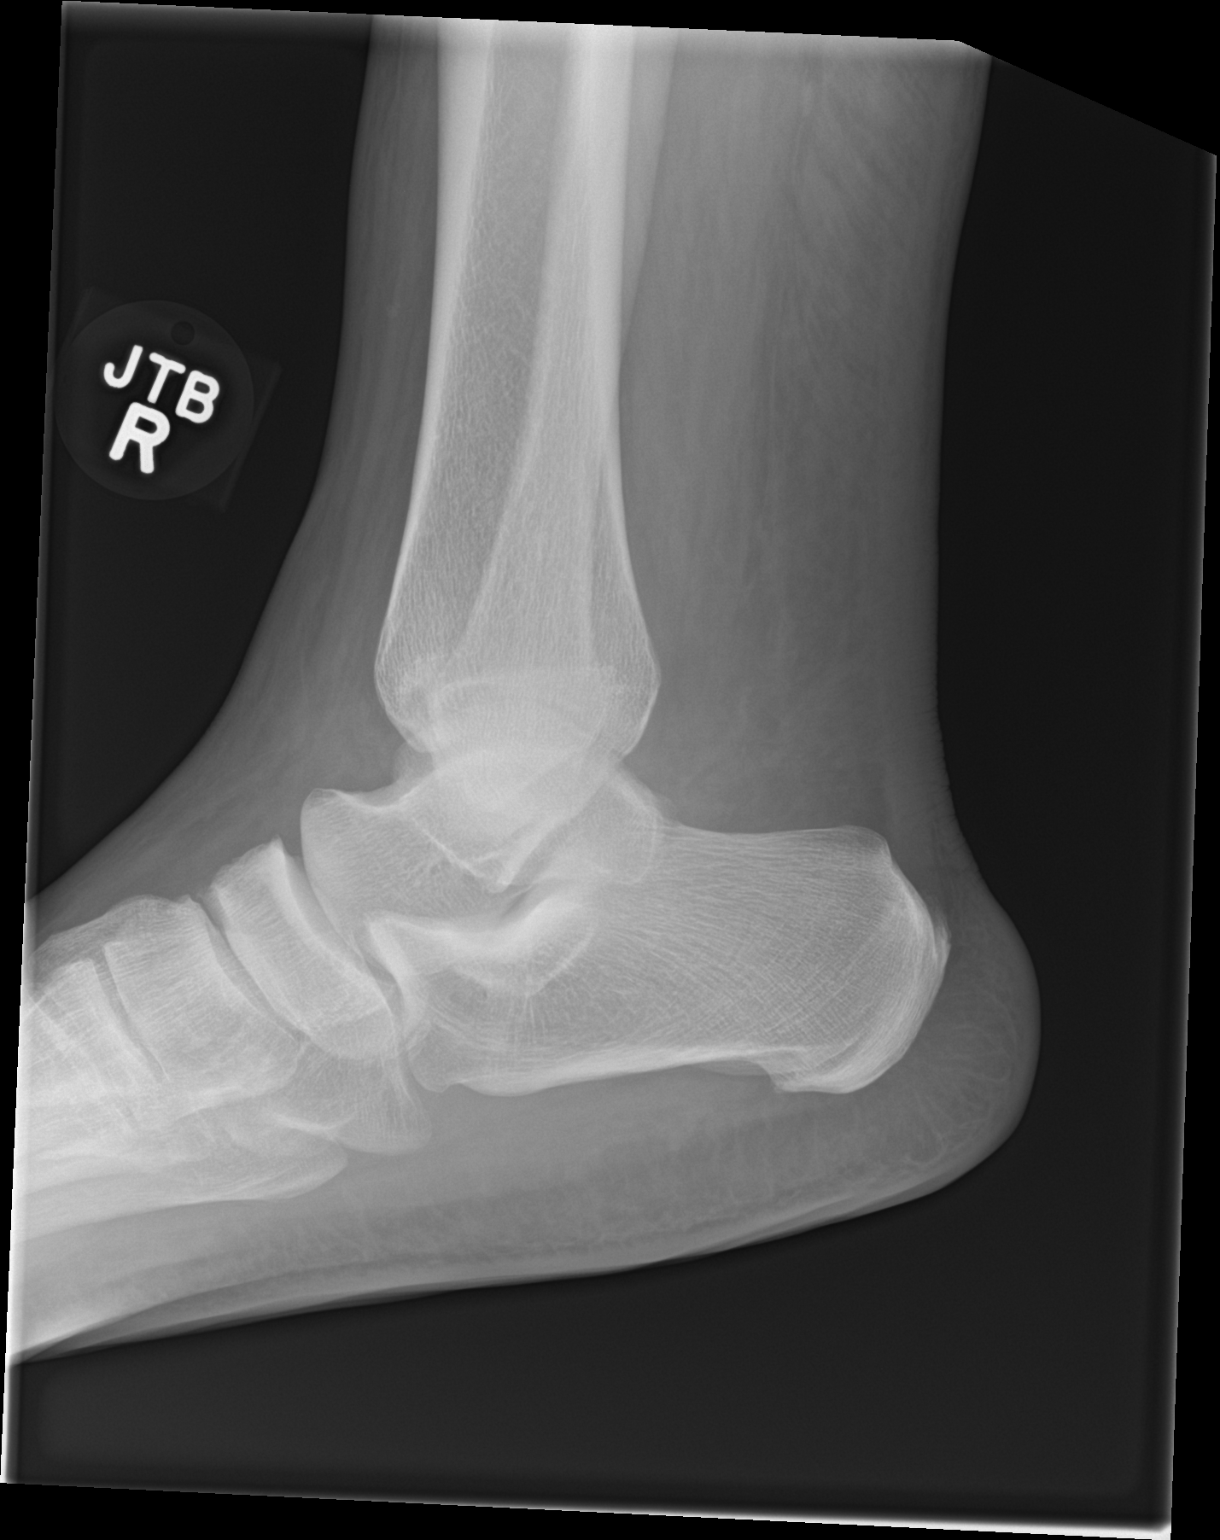

[3 of 3 positions shown; findings below may reference images not displayed]

FINDINGS: No acute displaced fracture. Ankle mortise appears congruent. No
radiopaque foreign body. No focal soft tissue swelling. Minimal
degenerative changes of the hindfoot.
IMPRESSION: Negative for acute bony abnormality

## 2022-06-19 ENCOUNTER — Encounter: Payer: Self-pay | Admitting: Internal Medicine

## 2022-06-19 NOTE — Telephone Encounter (Signed)
F/U: New insurace/  [employer]: ID: [id number] GR: [id number] BIN: [id number] PCN: [id number] Effective: 04/10/22 Phone: [phone]

## 2022-06-22 ENCOUNTER — Encounter: Payer: Self-pay | Admitting: Internal Medicine

## 2022-06-25 NOTE — Telephone Encounter (Signed)
Auth Submission: PENDING Payer: [employer] Medication & CPT/J Code(s) submitted: Avsola (infliximab-axxq) [id number] Route of submission (phone, fax, portal):  Phone # [phone] Fax # [phone] Auth type: Buy/Bill Units/visits requested: 5MG/KG Q8WKS Reference number: [id number] Approval from:  to  at

## 2022-06-25 NOTE — Telephone Encounter (Signed)
Ok to substitute for bio-similar at same dose and freq. CC: Dr. Benjamine Mola

## 2022-06-25 NOTE — Telephone Encounter (Signed)
Dr. Hilarie Fredrickson, Patient has new insurance with [employer] and will not cover remicade. It has been denied. Preferred medication is avsola. Would you like to try avsola?

## 2022-06-26 ENCOUNTER — Ambulatory Visit: Payer: 59

## 2022-07-08 NOTE — Telephone Encounter (Signed)
Very sorry to hear and I appreciate your help with him   Vaughan Basta, he will need a new infusion center without interruption in therapy if at all possible Can we try [facility] please Thanks JMP

## 2022-07-08 NOTE — Telephone Encounter (Signed)
Dr. Hilarie Fredrickson, Patient has new insurance and unfortunately [facility] is not a part of the [facility] network and it will be a non-covered service for the patient. Patient will need to be referred to another site. ([place])

## 2022-07-09 ENCOUNTER — Telehealth: Payer: Self-pay | Admitting: Pharmacy Technician

## 2022-07-09 ENCOUNTER — Telehealth: Payer: Self-pay | Admitting: Internal Medicine

## 2022-07-09 NOTE — Telephone Encounter (Signed)
Error

## 2022-07-09 NOTE — Telephone Encounter (Signed)
Inbound call from from Case manager Hassan Rowan with [facility] with a call back number at [phone] ext [phone]. Please advise.  Thank you

## 2022-07-09 NOTE — Telephone Encounter (Signed)
See additional phone note.

## 2022-07-09 NOTE — Telephone Encounter (Signed)
Casemanager states that pt can get the infusion if the drug can come from [facility] and a [facility] form would need to be filled out. Suggested she call the infusion center and speak with Ok Edwards to see if that is an option for pt.

## 2022-07-10 ENCOUNTER — Other Ambulatory Visit: Payer: Self-pay | Admitting: Pharmacy Technician

## 2022-07-14 ENCOUNTER — Telehealth: Payer: Self-pay | Admitting: Pharmacy Technician

## 2022-07-14 NOTE — Telephone Encounter (Signed)
Yes.  Please have Dr Hilarie Fredrickson sign the form and return it to me. Thanks

## 2022-07-14 NOTE — Telephone Encounter (Addendum)
Remicade f/u:  His insurance has given an exception (approval) for patient to receive treatment at [facility] due to no other facility being within network in our area.  Patient has been approved for PAP. Please sign updated forms and return to me. Forms has been faxed to [phone]  Thanks Maudie Mercury

## 2022-07-14 NOTE — Telephone Encounter (Signed)
Maudie Mercury do you just need the product request form filled out and returned?

## 2022-07-14 NOTE — Telephone Encounter (Signed)
Form faxed as requested.

## 2022-07-14 NOTE — Telephone Encounter (Signed)
Great news and thank you! This is definitely the best thing for our patient. Appreciate your work here [employer]

## 2022-07-28 ENCOUNTER — Ambulatory Visit (INDEPENDENT_AMBULATORY_CARE_PROVIDER_SITE_OTHER): Payer: Commercial Managed Care - HMO

## 2022-07-28 VITALS — BP 108/73 | HR 58 | Temp 98.6°F | Resp 18 | Ht 72.0 in | Wt 325.0 lb

## 2022-07-28 DIAGNOSIS — K50111 Crohn's disease of large intestine with rectal bleeding: Secondary | ICD-10-CM | POA: Diagnosis not present

## 2022-07-28 DIAGNOSIS — K5010 Crohn's disease of large intestine without complications: Secondary | ICD-10-CM

## 2022-07-28 MED ORDER — DIPHENHYDRAMINE HCL 25 MG PO CAPS
25.0000 mg | ORAL_CAPSULE | Freq: Once | ORAL | Status: AC
  Administered 2022-07-28: 25 mg via ORAL
  Filled 2022-07-28: qty 1

## 2022-07-28 MED ORDER — METHYLPREDNISOLONE SODIUM SUCC 40 MG IJ SOLR
40.0000 mg | Freq: Once | INTRAMUSCULAR | Status: AC
  Administered 2022-07-28: 40 mg via INTRAVENOUS
  Filled 2022-07-28: qty 1

## 2022-07-28 MED ORDER — SODIUM CHLORIDE 0.9 % IV SOLN
5.0000 mg/kg | Freq: Once | INTRAVENOUS | Status: AC
  Administered 2022-07-28: 700 mg via INTRAVENOUS
  Filled 2022-07-28: qty 70

## 2022-07-28 MED ORDER — ACETAMINOPHEN 325 MG PO TABS
650.0000 mg | ORAL_TABLET | Freq: Once | ORAL | Status: AC
  Administered 2022-07-28: 650 mg via ORAL
  Filled 2022-07-28: qty 2

## 2022-07-28 NOTE — Progress Notes (Signed)
Diagnosis: Crohn's Disease  Provider:  Marshell Garfinkel MD  Procedure: Infusion  IV Type: Peripheral, IV Location: R Antecubital  Remicade (Infliximab), Dose: 700 mg  Infusion Start Time: 1552  Infusion Stop Time: 1215  Post Infusion IV Care: Peripheral IV Discontinued  Discharge: Condition: Good, Destination: Home . AVS provided to patient.   Performed by:  Adelina Mings, LPN

## 2022-08-12 ENCOUNTER — Telehealth: Payer: Self-pay | Admitting: Pharmacy Technician

## 2022-08-12 NOTE — Telephone Encounter (Addendum)
[employer]  remicade PAP Approval: 06/26/22 - 06/27/23 Phone: [phone] Id: [id number]  Auth Submission: APPROVED Payer: PAP  Medication & CPT/J Code(s) submitted: Remicade (Infliximab) J1745 Route of submission (phone, fax, portal):  Phone # Fax # Auth type:  Units/visits requested:  Reference number:  Approval from: 06/26/22 to 06/27/23

## 2022-08-21 ENCOUNTER — Encounter: Payer: Self-pay | Admitting: Internal Medicine

## 2022-08-25 ENCOUNTER — Encounter: Payer: Self-pay | Admitting: Internal Medicine

## 2022-09-09 ENCOUNTER — Telehealth: Payer: Self-pay | Admitting: Pharmacy Technician

## 2022-09-09 NOTE — Telephone Encounter (Signed)
Form faxed back to [facility].

## 2022-09-09 NOTE — Telephone Encounter (Signed)
Jonathan Drake,  Re-order form for Remicade has been faxed to; [phone].  Please have Dr. Hilarie Fredrickson sign form and return to me. Patient has upcoming appt 09/21/22.  Thanks Maudie Mercury

## 2022-09-21 ENCOUNTER — Ambulatory Visit (INDEPENDENT_AMBULATORY_CARE_PROVIDER_SITE_OTHER): Payer: Commercial Managed Care - HMO

## 2022-09-21 VITALS — BP 114/76 | HR 60 | Temp 98.0°F | Resp 18 | Ht 72.0 in | Wt 323.0 lb

## 2022-09-21 DIAGNOSIS — K50111 Crohn's disease of large intestine with rectal bleeding: Secondary | ICD-10-CM | POA: Diagnosis not present

## 2022-09-21 DIAGNOSIS — K5010 Crohn's disease of large intestine without complications: Secondary | ICD-10-CM

## 2022-09-21 MED ORDER — ACETAMINOPHEN 325 MG PO TABS
650.0000 mg | ORAL_TABLET | Freq: Once | ORAL | Status: AC
  Administered 2022-09-21: 650 mg via ORAL
  Filled 2022-09-21: qty 2

## 2022-09-21 MED ORDER — SODIUM CHLORIDE 0.9 % IV SOLN
5.0000 mg/kg | Freq: Once | INTRAVENOUS | Status: AC
  Administered 2022-09-21: 700 mg via INTRAVENOUS
  Filled 2022-09-21: qty 70

## 2022-09-21 MED ORDER — METHYLPREDNISOLONE SODIUM SUCC 40 MG IJ SOLR
40.0000 mg | Freq: Once | INTRAMUSCULAR | Status: AC
  Administered 2022-09-21: 40 mg via INTRAVENOUS
  Filled 2022-09-21: qty 1

## 2022-09-21 MED ORDER — DIPHENHYDRAMINE HCL 25 MG PO CAPS
25.0000 mg | ORAL_CAPSULE | Freq: Once | ORAL | Status: AC
  Administered 2022-09-21: 25 mg via ORAL
  Filled 2022-09-21: qty 1

## 2022-09-21 NOTE — Progress Notes (Signed)
Diagnosis: Crohn's Disease  Provider:  Marshell Garfinkel MD  Procedure: Infusion  IV Type: Peripheral, IV Location: L Forearm  Remicade (Infliximab), Dose: 762m  Infusion Start Time: [id number] Infusion Stop Time: [id number] Post Infusion IV Care: Peripheral IV Discontinued  Discharge: Condition: Good, Destination: Home . AVS Provided and AVS Declined  Performed by:  CKoren Shiver RN

## 2022-11-16 ENCOUNTER — Ambulatory Visit (INDEPENDENT_AMBULATORY_CARE_PROVIDER_SITE_OTHER): Payer: Self-pay

## 2022-11-16 VITALS — BP 124/81 | HR 56 | Temp 98.5°F | Resp 16 | Ht 72.0 in | Wt 323.6 lb

## 2022-11-16 DIAGNOSIS — K5010 Crohn's disease of large intestine without complications: Secondary | ICD-10-CM

## 2022-11-16 DIAGNOSIS — K50111 Crohn's disease of large intestine with rectal bleeding: Secondary | ICD-10-CM

## 2022-11-16 MED ORDER — SODIUM CHLORIDE 0.9 % IV SOLN
5.0000 mg/kg | Freq: Once | INTRAVENOUS | Status: AC
  Administered 2022-11-16: 700 mg via INTRAVENOUS
  Filled 2022-11-16: qty 70

## 2022-11-16 MED ORDER — ACETAMINOPHEN 325 MG PO TABS
650.0000 mg | ORAL_TABLET | Freq: Once | ORAL | Status: AC
  Administered 2022-11-16: 650 mg via ORAL
  Filled 2022-11-16: qty 2

## 2022-11-16 MED ORDER — DIPHENHYDRAMINE HCL 25 MG PO CAPS
25.0000 mg | ORAL_CAPSULE | Freq: Once | ORAL | Status: AC
  Administered 2022-11-16: 25 mg via ORAL
  Filled 2022-11-16: qty 1

## 2022-11-16 MED ORDER — METHYLPREDNISOLONE SODIUM SUCC 40 MG IJ SOLR
40.0000 mg | Freq: Once | INTRAMUSCULAR | Status: AC
  Administered 2022-11-16: 40 mg via INTRAVENOUS
  Filled 2022-11-16: qty 1

## 2022-11-16 NOTE — Progress Notes (Signed)
Diagnosis: Infusion  Provider:  Chilton Greathouse MD  Procedure: Infusion  IV Type: Peripheral, IV Location: R Antecubital  Remicade (Infliximab), Dose: 700mg   Infusion Start Time: 1009  Infusion Stop Time: 1218  Post Infusion IV Care: Peripheral IV Discontinued  Discharge: Condition: Good, Destination: Home . AVS Provided  Performed by:  Loney Hering, LPN

## 2022-11-27 ENCOUNTER — Encounter: Payer: Self-pay | Admitting: Internal Medicine

## 2023-01-07 ENCOUNTER — Encounter: Payer: Self-pay | Admitting: Internal Medicine

## 2023-01-11 ENCOUNTER — Ambulatory Visit: Payer: Commercial Managed Care - HMO

## 2023-01-18 ENCOUNTER — Ambulatory Visit: Payer: Commercial Managed Care - HMO

## 2023-01-25 ENCOUNTER — Ambulatory Visit (INDEPENDENT_AMBULATORY_CARE_PROVIDER_SITE_OTHER): Payer: Self-pay | Admitting: *Deleted

## 2023-01-25 VITALS — BP 132/72 | HR 68 | Temp 98.1°F | Resp 16 | Ht 72.0 in | Wt 317.6 lb

## 2023-01-25 DIAGNOSIS — K5010 Crohn's disease of large intestine without complications: Secondary | ICD-10-CM

## 2023-01-25 DIAGNOSIS — K50111 Crohn's disease of large intestine with rectal bleeding: Secondary | ICD-10-CM

## 2023-01-25 MED ORDER — ACETAMINOPHEN 325 MG PO TABS
650.0000 mg | ORAL_TABLET | Freq: Once | ORAL | Status: AC
  Administered 2023-01-25: 650 mg via ORAL
  Filled 2023-01-25: qty 2

## 2023-01-25 MED ORDER — DIPHENHYDRAMINE HCL 25 MG PO CAPS
25.0000 mg | ORAL_CAPSULE | Freq: Once | ORAL | Status: AC
  Administered 2023-01-25: 25 mg via ORAL
  Filled 2023-01-25: qty 1

## 2023-01-25 MED ORDER — SODIUM CHLORIDE 0.9 % IV SOLN
5.0000 mg/kg | Freq: Once | INTRAVENOUS | Status: AC
  Administered 2023-01-25: 700 mg via INTRAVENOUS
  Filled 2023-01-25: qty 70

## 2023-01-25 MED ORDER — METHYLPREDNISOLONE SODIUM SUCC 40 MG IJ SOLR
40.0000 mg | Freq: Once | INTRAMUSCULAR | Status: AC
  Administered 2023-01-25: 40 mg via INTRAVENOUS
  Filled 2023-01-25: qty 1

## 2023-01-25 NOTE — Progress Notes (Signed)
Diagnosis: Crohn's Disease  Provider:  Chilton Greathouse MD  Procedure: IV Infusion  IV Type: Peripheral, IV Location: L Forearm  Remicade (Infliximab), Dose: 700 mg  Infusion Start Time: 1037 am  Infusion Stop Time: 1255 pm  Post Infusion IV Care: Observation period completed and Peripheral IV Discontinued  Discharge: Condition: Good, Destination: Home . AVS Provided  Performed by:  Forrest Moron, RN

## 2023-03-15 ENCOUNTER — Ambulatory Visit: Payer: 59

## 2023-03-22 ENCOUNTER — Ambulatory Visit: Payer: 59

## 2023-04-08 ENCOUNTER — Ambulatory Visit (INDEPENDENT_AMBULATORY_CARE_PROVIDER_SITE_OTHER): Payer: 59 | Admitting: *Deleted

## 2023-04-08 VITALS — BP 130/85 | HR 66 | Temp 97.8°F | Resp 16 | Ht 72.0 in | Wt 311.4 lb

## 2023-04-08 DIAGNOSIS — K5010 Crohn's disease of large intestine without complications: Secondary | ICD-10-CM

## 2023-04-08 DIAGNOSIS — K50111 Crohn's disease of large intestine with rectal bleeding: Secondary | ICD-10-CM

## 2023-04-08 MED ORDER — DIPHENHYDRAMINE HCL 25 MG PO CAPS
25.0000 mg | ORAL_CAPSULE | Freq: Once | ORAL | Status: AC
  Administered 2023-04-08: 25 mg via ORAL
  Filled 2023-04-08: qty 1

## 2023-04-08 MED ORDER — ACETAMINOPHEN 325 MG PO TABS
650.0000 mg | ORAL_TABLET | Freq: Once | ORAL | Status: AC
  Administered 2023-04-08: 650 mg via ORAL
  Filled 2023-04-08: qty 2

## 2023-04-08 MED ORDER — METHYLPREDNISOLONE SODIUM SUCC 40 MG IJ SOLR
40.0000 mg | Freq: Once | INTRAMUSCULAR | Status: AC
  Administered 2023-04-08: 40 mg via INTRAVENOUS
  Filled 2023-04-08: qty 1

## 2023-04-08 MED ORDER — SODIUM CHLORIDE 0.9 % IV SOLN
5.0000 mg/kg | Freq: Once | INTRAVENOUS | Status: AC
  Administered 2023-04-08: 700 mg via INTRAVENOUS
  Filled 2023-04-08: qty 70

## 2023-04-08 NOTE — Progress Notes (Signed)
Diagnosis: Crohn's Disease  Provider:  Chilton Greathouse MD  Procedure: IV Infusion  IV Type: Peripheral, IV Location: R Antecubital  Remicade (Infliximab), Dose: 700 mg  Infusion Start Time: 1009 am  Infusion Stop Time: 1230 pm  Post Infusion IV Care: Observation period completed and Peripheral IV Discontinued  Discharge: Condition: Good, Destination: Home . AVS Provided  Performed by:  Forrest Moron, RN

## 2023-05-10 ENCOUNTER — Telehealth: Payer: Self-pay | Admitting: Pharmacy Technician

## 2023-05-10 NOTE — Telephone Encounter (Signed)
Received notification from [employer],  PAP will be discontinued as of 08/11/23.   I have emailed the info to [employer] Lenard Galloway and notification has been scanned to media tab. Per [employer] patient will remain in program, but will need to complete a new application.  Info has been emailed to [employer].

## 2023-06-10 ENCOUNTER — Ambulatory Visit (INDEPENDENT_AMBULATORY_CARE_PROVIDER_SITE_OTHER): Payer: 59

## 2023-06-10 VITALS — BP 114/73 | HR 70 | Temp 98.4°F | Resp 16 | Ht 72.0 in | Wt 307.2 lb

## 2023-06-10 DIAGNOSIS — K50111 Crohn's disease of large intestine with rectal bleeding: Secondary | ICD-10-CM | POA: Diagnosis not present

## 2023-06-10 DIAGNOSIS — K5010 Crohn's disease of large intestine without complications: Secondary | ICD-10-CM

## 2023-06-10 MED ORDER — METHYLPREDNISOLONE SODIUM SUCC 40 MG IJ SOLR
40.0000 mg | Freq: Once | INTRAMUSCULAR | Status: AC
Start: 2023-06-10 — End: 2023-06-10
  Administered 2023-06-10: 40 mg via INTRAVENOUS
  Filled 2023-06-10: qty 1

## 2023-06-10 MED ORDER — INFLIXIMAB 100 MG IV SOLR
5.0000 mg/kg | Freq: Once | INTRAVENOUS | Status: AC
  Administered 2023-06-10: 700 mg via INTRAVENOUS
  Filled 2023-06-10: qty 70

## 2023-06-10 MED ORDER — ACETAMINOPHEN 325 MG PO TABS
650.0000 mg | ORAL_TABLET | Freq: Once | ORAL | Status: AC
  Administered 2023-06-10: 650 mg via ORAL
  Filled 2023-06-10: qty 2

## 2023-06-10 MED ORDER — DIPHENHYDRAMINE HCL 25 MG PO CAPS
25.0000 mg | ORAL_CAPSULE | Freq: Once | ORAL | Status: AC
  Administered 2023-06-10: 25 mg via ORAL
  Filled 2023-06-10: qty 1

## 2023-06-10 NOTE — Progress Notes (Signed)
Diagnosis: Crohn's Disease  Provider:  Chilton Greathouse MD  Procedure: IV Infusion  IV Type: Peripheral, IV Location: R Hand  Remicade (Infliximab), Dose: 700 mg  Infusion Start Time: 0900  Infusion Stop Time: 1115  Post Infusion IV Care: Peripheral IV Discontinued  Discharge: Condition: Good, Destination: Home . AVS Declined  Performed by:  Rico Ala, LPN

## 2023-06-17 ENCOUNTER — Telehealth: Payer: Self-pay | Admitting: Pharmacy Technician

## 2023-06-17 NOTE — Telephone Encounter (Signed)
Our last visit with him was June 2023 Jonathan Bradford, do think this will suffice? [name], if not, we need to get the patient seen ASAP in the office or by video visit so that we can reinitiate drug assistance program for infliximab We do not want this therapy to lapse JMP

## 2023-06-17 NOTE — Telephone Encounter (Signed)
Dr. Rhea Belton /  [name]  Patient will need to be re-enrolled in the PAP (free drug) program, which will expire on 06/27/23.  I will need to submit a new authorization to the insurance for Remicade.  They are requesting new updated notes stating the patient is doing well on Remicade.  Once I have an approval from the insurance the patient will be re-enrolled in the PAP (free drug) program Please provide new updated chart notes.  Selena Batten

## 2023-06-18 NOTE — Telephone Encounter (Signed)
Pt scheduled to see [facility] 06/28/23 at 11am. Pt aware of appt. Pt will need labs also after visit.

## 2023-06-28 ENCOUNTER — Ambulatory Visit: Payer: 59 | Admitting: Gastroenterology

## 2023-07-16 ENCOUNTER — Telehealth: Payer: Self-pay | Admitting: Pharmacy Technician

## 2023-07-16 NOTE — Telephone Encounter (Signed)
Pt was scheduled for an OV on 06/28/23 and did a same day cancellation. I just placed a call to him and had to leave a message requesting he call back asap to be scheduled in order to be able to continue to get his medication.

## 2023-07-16 NOTE — Telephone Encounter (Signed)
Thanks for the update.    Kim

## 2023-07-16 NOTE — Telephone Encounter (Addendum)
Jonathan Drake,  Wanted to f/u on this patient. Did the patient have a f/u appt yet?  I did not see anything in his chart notes. His PAP (free drug) has expired. (06/24/23)  Remicade is non-preferred; the insurance and PAP (free drug) is requesting documentation that the patient is doing well on Remicade. His next scheduled appt is 08/05/23.  Thanks Selena Batten

## 2023-07-19 ENCOUNTER — Telehealth: Payer: Self-pay | Admitting: Pharmacy Technician

## 2023-07-19 NOTE — Telephone Encounter (Signed)
Jonathan Drake,  F/u: Insurance called and Remicade will likely be denied due to it non preferred. They will need updated chart notes for move forward with the authorization process.  Once we have updated chart notes, please call [phone] to conduct a peer to peer for Remicade, otherwise it will be denied. Unfortunately I am unable to conduct peer to peer, they must be done by MD/Rn.  Ref: [id number]  Selena Batten

## 2023-07-19 NOTE — Telephone Encounter (Signed)
Called and left message for pt to call back asap to schedule an OV to be able to get his medications.

## 2023-07-19 NOTE — Telephone Encounter (Signed)
I left another message today for him to call back but have not heard from him.

## 2023-07-21 ENCOUNTER — Telehealth: Payer: Self-pay | Admitting: Pharmacy Technician

## 2023-07-21 NOTE — Telephone Encounter (Addendum)
Manufacturer Assistance: Denied Due to patient has not failed tried step therapy. Preferred med is [employer] Benes Remicade:  PAP [id number] EXP: 07/20/23 - 07/19/24

## 2023-07-21 NOTE — Telephone Encounter (Addendum)
Linda, [employer] (free drug) has DENIED Remicade due to patient has not tried preferred medication. Preferred medication is Avsola.  Jonathan Drake

## 2023-07-21 NOTE — Telephone Encounter (Signed)
Great! I called his S/O and she states she is going to have him call to schedule an appt.

## 2023-07-21 NOTE — Telephone Encounter (Signed)
Spoke with pts S/O and let her know pt needs to call and schedule an appt to be able to get his medication. His med will not be approved without updated notes. She states she will have him contact the office.

## 2023-07-26 ENCOUNTER — Telehealth: Payer: Self-pay | Admitting: Pharmacy Technician

## 2023-07-26 NOTE — Telephone Encounter (Signed)
Dr. Rhea Belton / [name].  Update: Unfortunately, [employer] will not re-activate the patients re-enrollment due to he has not tried step therapy.  Preferred medication is Avsola. Would you like to transition patient to Avsola?  Selena Batten

## 2023-07-26 NOTE — Telephone Encounter (Signed)
I am okay with that transition to the biosimilar as requested Can we proceed with the new medication ASAP?

## 2023-07-27 ENCOUNTER — Telehealth: Payer: Self-pay | Admitting: Pharmacy Technician

## 2023-07-27 NOTE — Telephone Encounter (Signed)
Kim have you faxed the form yet? I looked at all 3 fax machines and have not seen anything. Please let me know when you send it.

## 2023-07-27 NOTE — Telephone Encounter (Addendum)
Dr.  Rhea Belton, I will fax over the PAP (free drug) forms for your signature. @[facility] , please enter a new treatment plan for Avsola.  (Last OV was 6/23)   Patient will neeed updated charts notes to proceed with authorization process.  (Patient is aware)  Thanks Selena Batten

## 2023-07-27 NOTE — Telephone Encounter (Signed)
Patient has been enrolled in the Avsola co-pay card to assist with financial cost.  Since treatment has changed to Avsola patient has been enrolled in the Avsola co-pay card to assist with financial cost.    Approved: Exp date 06/09/26.  Co-Pay Member ID [id number] BIN [id number] PCN [id number] Group Number [id number]

## 2023-07-27 NOTE — Telephone Encounter (Signed)
[phone]  ATTN: Dr. Rhea Belton

## 2023-07-27 NOTE — Telephone Encounter (Signed)
Jonathan Drake, if pt needs to be seen to update medical hx please arrange ASAP with APP I am at hospital this week so please fax me the necessary signature pages for Antony Odea to the [facility] fax # below  Please add cover sheet to my attention [phone] Thanks JMP

## 2023-07-27 NOTE — Telephone Encounter (Signed)
error 

## 2023-07-27 NOTE — Telephone Encounter (Signed)
He was scheduled to see [facility] 11/18 and same day cancelled. I have left him multiple messages, just left another one, for him to call and schedule an appt and why we need him to for the updated notes. Also called and spoke with pts S/O and she stated about a week ago she would have him call and schedule an appt and he has not done so yet.  Will fax the form as soon as I get it.

## 2023-07-28 NOTE — Telephone Encounter (Signed)
Left another message for pt to call back to schedule an appt.

## 2023-07-29 NOTE — Telephone Encounter (Signed)
Attempted to reach pt again today without success. Letter mailed requesting that he contact the office to schedule an appt.

## 2023-08-05 ENCOUNTER — Ambulatory Visit: Payer: 59
